# Patient Record
Sex: Female | Born: 1980 | Race: Black or African American | Hispanic: No | Marital: Married | Smoking: Never smoker
Health system: Southern US, Community
[De-identification: ages and names within clinical notes are randomized; demographics above are authoritative.]

## PROBLEM LIST (undated history)

## (undated) DIAGNOSIS — Z302 Encounter for sterilization: Secondary | ICD-10-CM

## (undated) DIAGNOSIS — D649 Anemia, unspecified: Secondary | ICD-10-CM

## (undated) DIAGNOSIS — K649 Unspecified hemorrhoids: Secondary | ICD-10-CM

## (undated) DIAGNOSIS — O009 Unspecified ectopic pregnancy without intrauterine pregnancy: Secondary | ICD-10-CM

## (undated) DIAGNOSIS — C801 Malignant (primary) neoplasm, unspecified: Secondary | ICD-10-CM

## (undated) DIAGNOSIS — Z90710 Acquired absence of both cervix and uterus: Secondary | ICD-10-CM

## (undated) DIAGNOSIS — K449 Diaphragmatic hernia without obstruction or gangrene: Secondary | ICD-10-CM

## (undated) DIAGNOSIS — I10 Essential (primary) hypertension: Secondary | ICD-10-CM

## (undated) DIAGNOSIS — J45909 Unspecified asthma, uncomplicated: Secondary | ICD-10-CM

## (undated) DIAGNOSIS — E669 Obesity, unspecified: Secondary | ICD-10-CM

## (undated) DIAGNOSIS — J40 Bronchitis, not specified as acute or chronic: Secondary | ICD-10-CM

## (undated) DIAGNOSIS — T7840XA Allergy, unspecified, initial encounter: Secondary | ICD-10-CM

## (undated) DIAGNOSIS — K222 Esophageal obstruction: Secondary | ICD-10-CM

## (undated) DIAGNOSIS — K589 Irritable bowel syndrome without diarrhea: Secondary | ICD-10-CM

## (undated) DIAGNOSIS — K219 Gastro-esophageal reflux disease without esophagitis: Secondary | ICD-10-CM

## (undated) DIAGNOSIS — J309 Allergic rhinitis, unspecified: Secondary | ICD-10-CM

## (undated) DIAGNOSIS — R0683 Snoring: Secondary | ICD-10-CM

## (undated) DIAGNOSIS — K8020 Calculus of gallbladder without cholecystitis without obstruction: Secondary | ICD-10-CM

## (undated) HISTORY — DX: Unspecified hemorrhoids: K64.9

## (undated) HISTORY — PX: TUBAL LIGATION: SHX77

## (undated) HISTORY — DX: Esophageal obstruction: K22.2

## (undated) HISTORY — DX: Allergic rhinitis, unspecified: J30.9

## (undated) HISTORY — DX: Calculus of gallbladder without cholecystitis without obstruction: K80.20

## (undated) HISTORY — PX: CRYOABLATION: SHX1415

## (undated) HISTORY — PX: UPPER GI ENDOSCOPY: SHX6162

## (undated) HISTORY — DX: Essential (primary) hypertension: I10

## (undated) HISTORY — DX: Gastro-esophageal reflux disease without esophagitis: K21.9

## (undated) HISTORY — DX: Snoring: R06.83

## (undated) HISTORY — DX: Irritable bowel syndrome, unspecified: K58.9

## (undated) HISTORY — DX: Obesity, unspecified: E66.9

## (undated) HISTORY — DX: Diaphragmatic hernia without obstruction or gangrene: K44.9

## (undated) HISTORY — PX: SALPINGOOPHORECTOMY: SHX82

## (undated) HISTORY — DX: Allergy, unspecified, initial encounter: T78.40XA

---

## 2001-04-24 ENCOUNTER — Other Ambulatory Visit: Admission: RE | Admit: 2001-04-24 | Discharge: 2001-04-24 | Payer: Self-pay | Admitting: Obstetrics and Gynecology

## 2001-05-01 ENCOUNTER — Observation Stay (HOSPITAL_COMMUNITY): Admission: AD | Admit: 2001-05-01 | Discharge: 2001-05-02 | Payer: Self-pay | Admitting: Obstetrics & Gynecology

## 2001-05-01 ENCOUNTER — Encounter: Payer: Self-pay | Admitting: Obstetrics & Gynecology

## 2001-05-02 ENCOUNTER — Encounter: Payer: Self-pay | Admitting: Obstetrics & Gynecology

## 2001-05-13 ENCOUNTER — Inpatient Hospital Stay (HOSPITAL_COMMUNITY): Admission: AD | Admit: 2001-05-13 | Discharge: 2001-05-13 | Payer: Self-pay | Admitting: Obstetrics and Gynecology

## 2001-05-16 ENCOUNTER — Ambulatory Visit (HOSPITAL_COMMUNITY): Admission: RE | Admit: 2001-05-16 | Discharge: 2001-05-16 | Payer: Self-pay | Admitting: Obstetrics & Gynecology

## 2001-05-16 ENCOUNTER — Encounter: Payer: Self-pay | Admitting: Obstetrics & Gynecology

## 2001-08-19 ENCOUNTER — Inpatient Hospital Stay (HOSPITAL_COMMUNITY): Admission: AD | Admit: 2001-08-19 | Discharge: 2001-08-19 | Payer: Self-pay | Admitting: Obstetrics & Gynecology

## 2001-08-19 ENCOUNTER — Encounter: Payer: Self-pay | Admitting: Obstetrics and Gynecology

## 2001-08-28 ENCOUNTER — Inpatient Hospital Stay (HOSPITAL_COMMUNITY): Admission: AD | Admit: 2001-08-28 | Discharge: 2001-08-28 | Payer: Self-pay | Admitting: Obstetrics and Gynecology

## 2001-09-19 ENCOUNTER — Inpatient Hospital Stay (HOSPITAL_COMMUNITY): Admission: AD | Admit: 2001-09-19 | Discharge: 2001-09-25 | Payer: Self-pay | Admitting: *Deleted

## 2001-09-21 ENCOUNTER — Encounter: Payer: Self-pay | Admitting: *Deleted

## 2001-09-23 ENCOUNTER — Encounter: Payer: Self-pay | Admitting: Obstetrics and Gynecology

## 2001-09-25 ENCOUNTER — Encounter: Payer: Self-pay | Admitting: *Deleted

## 2001-10-07 ENCOUNTER — Encounter: Payer: Self-pay | Admitting: Obstetrics & Gynecology

## 2001-10-07 ENCOUNTER — Inpatient Hospital Stay (HOSPITAL_COMMUNITY): Admission: AD | Admit: 2001-10-07 | Discharge: 2001-10-07 | Payer: Self-pay | Admitting: Obstetrics & Gynecology

## 2001-10-29 ENCOUNTER — Inpatient Hospital Stay (HOSPITAL_COMMUNITY): Admission: AD | Admit: 2001-10-29 | Discharge: 2001-10-29 | Payer: Self-pay | Admitting: Obstetrics and Gynecology

## 2001-11-17 ENCOUNTER — Inpatient Hospital Stay (HOSPITAL_COMMUNITY): Admission: AD | Admit: 2001-11-17 | Discharge: 2001-11-17 | Payer: Self-pay | Admitting: Obstetrics and Gynecology

## 2001-11-26 ENCOUNTER — Inpatient Hospital Stay (HOSPITAL_COMMUNITY): Admission: AD | Admit: 2001-11-26 | Discharge: 2001-11-26 | Payer: Self-pay | Admitting: Obstetrics and Gynecology

## 2001-11-29 ENCOUNTER — Inpatient Hospital Stay (HOSPITAL_COMMUNITY): Admission: AD | Admit: 2001-11-29 | Discharge: 2001-12-01 | Payer: Self-pay | Admitting: Obstetrics and Gynecology

## 2002-01-09 ENCOUNTER — Other Ambulatory Visit: Admission: RE | Admit: 2002-01-09 | Discharge: 2002-01-09 | Payer: Self-pay | Admitting: Obstetrics and Gynecology

## 2004-01-13 ENCOUNTER — Emergency Department (HOSPITAL_COMMUNITY): Admission: EM | Admit: 2004-01-13 | Discharge: 2004-01-13 | Payer: Self-pay | Admitting: Family Medicine

## 2005-03-14 ENCOUNTER — Emergency Department (HOSPITAL_COMMUNITY): Admission: EM | Admit: 2005-03-14 | Discharge: 2005-03-14 | Payer: Self-pay | Admitting: Emergency Medicine

## 2006-04-07 ENCOUNTER — Emergency Department (HOSPITAL_COMMUNITY): Admission: EM | Admit: 2006-04-07 | Discharge: 2006-04-07 | Payer: Self-pay | Admitting: Emergency Medicine

## 2007-07-04 HISTORY — PX: CHOLECYSTECTOMY: SHX55

## 2007-07-16 ENCOUNTER — Encounter: Admission: RE | Admit: 2007-07-16 | Discharge: 2007-07-16 | Payer: Self-pay | Admitting: Internal Medicine

## 2007-07-25 ENCOUNTER — Ambulatory Visit: Payer: Self-pay | Admitting: Internal Medicine

## 2007-07-25 DIAGNOSIS — J45909 Unspecified asthma, uncomplicated: Secondary | ICD-10-CM | POA: Insufficient documentation

## 2007-07-25 DIAGNOSIS — J310 Chronic rhinitis: Secondary | ICD-10-CM

## 2007-07-31 ENCOUNTER — Telehealth: Payer: Self-pay | Admitting: Internal Medicine

## 2007-11-26 ENCOUNTER — Emergency Department (HOSPITAL_COMMUNITY): Admission: EM | Admit: 2007-11-26 | Discharge: 2007-11-27 | Payer: Self-pay | Admitting: Emergency Medicine

## 2007-11-29 ENCOUNTER — Encounter: Admission: RE | Admit: 2007-11-29 | Discharge: 2007-11-29 | Payer: Self-pay | Admitting: Internal Medicine

## 2007-12-27 ENCOUNTER — Encounter (INDEPENDENT_AMBULATORY_CARE_PROVIDER_SITE_OTHER): Payer: Self-pay | Admitting: General Surgery

## 2007-12-27 ENCOUNTER — Observation Stay (HOSPITAL_COMMUNITY): Admission: EM | Admit: 2007-12-27 | Discharge: 2007-12-28 | Payer: Self-pay | Admitting: Emergency Medicine

## 2008-01-10 ENCOUNTER — Ambulatory Visit: Payer: Self-pay | Admitting: Vascular Surgery

## 2008-01-10 ENCOUNTER — Emergency Department (HOSPITAL_COMMUNITY): Admission: EM | Admit: 2008-01-10 | Discharge: 2008-01-10 | Payer: Self-pay | Admitting: Emergency Medicine

## 2008-01-13 ENCOUNTER — Encounter: Admission: RE | Admit: 2008-01-13 | Discharge: 2008-01-13 | Payer: Self-pay | Admitting: General Surgery

## 2009-07-01 IMAGING — CR DG CERVICAL SPINE 1V
1 series · 1 of 1 positions shown · non-contrast
Comparison: none

CLINICAL DATA: Feels like throat closes up. Some shortness of breath.
 LATERAL VIEW OF THE NECK:

[w c-spine lat]
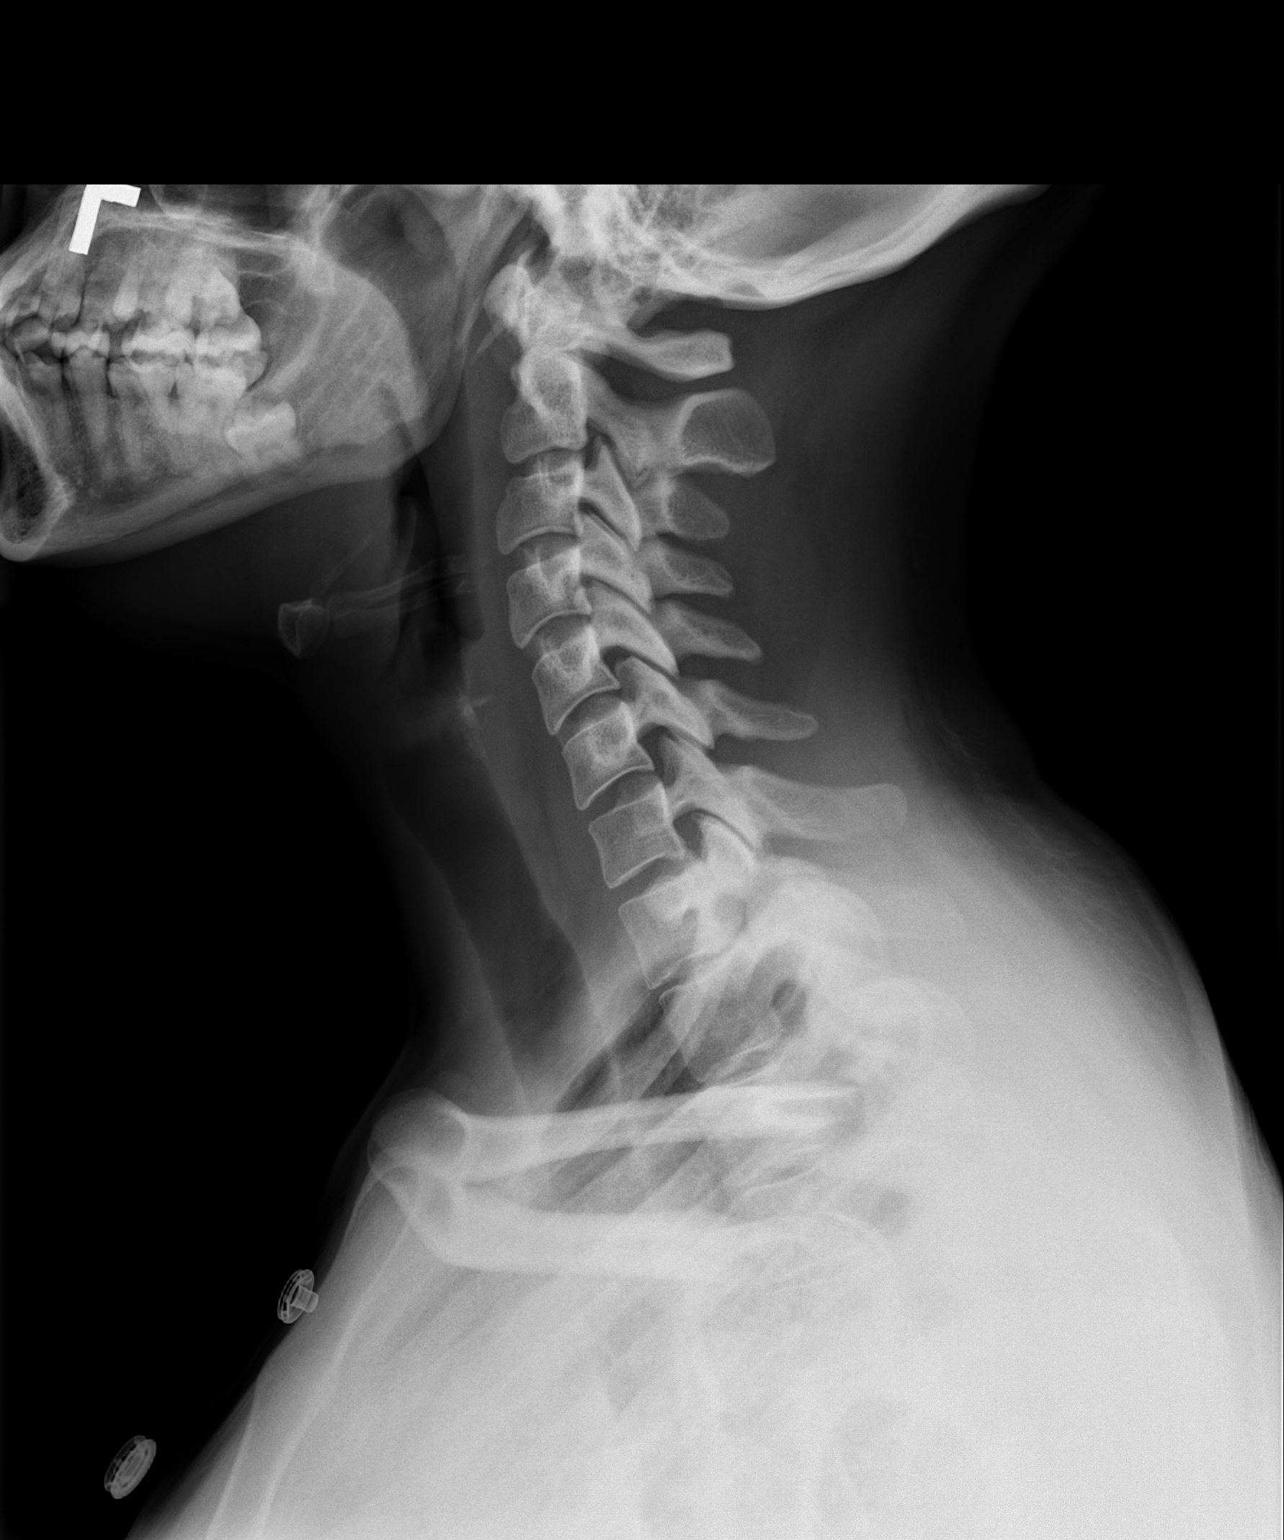

[1 of 1 positions shown; findings below may reference images not displayed]

FINDINGS: A lateral view of the neck shows the cervical vertebra to be in normal alignment with normal disc spaces.  No prevertebral soft tissue swelling is seen.  The airway appears normal.  The epiglottis is well visualized and is within normal limits.
IMPRESSION: Negative lateral soft tissue view of the neck.

## 2009-12-12 IMAGING — RF DG CHOLANGIOGRAM OPERATIVE
1 series · 17 of 24 positions shown · non-contrast
Comparison: Abdominal ultrasound 11/29/2007.

CLINICAL DATA: Cholelithiasis.  Laparoscopic cholecystectomy.

INTRAOPERATIVE CHOLANGIOGRAM
TECHNIQUE: Multiple fluoroscopic spot radiographs were obtained
during intraoperative cholangiogram and are submitted for
interpretation post-operatively.

[Series 0: run · 17 of 33 slices shown]
[im 1/33]
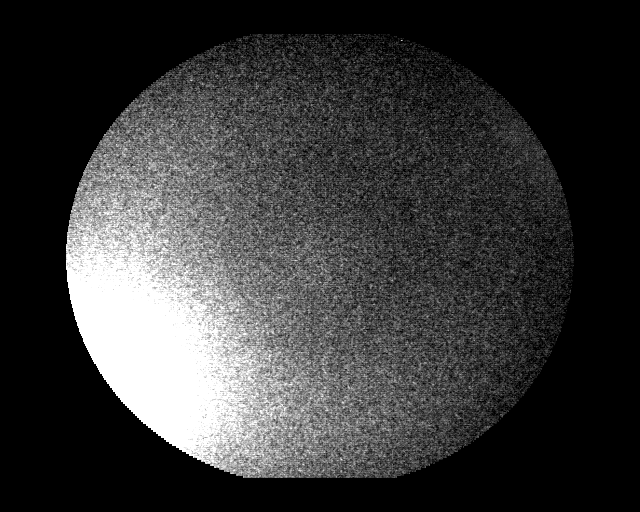
[im 3/33]
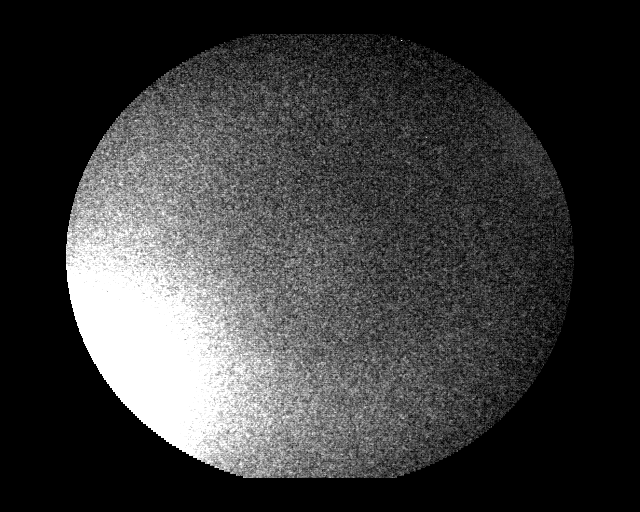
[im 5/33]
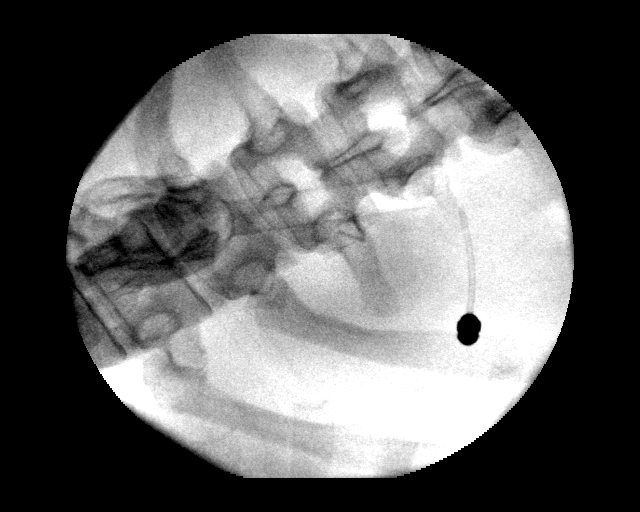
[im 6/33]
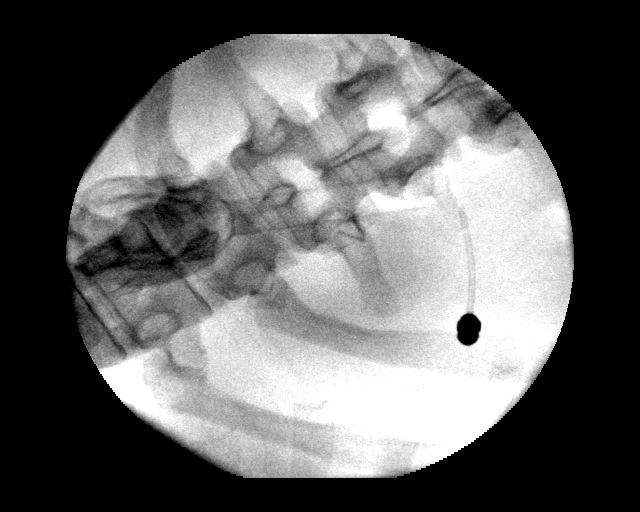
[im 9/33]
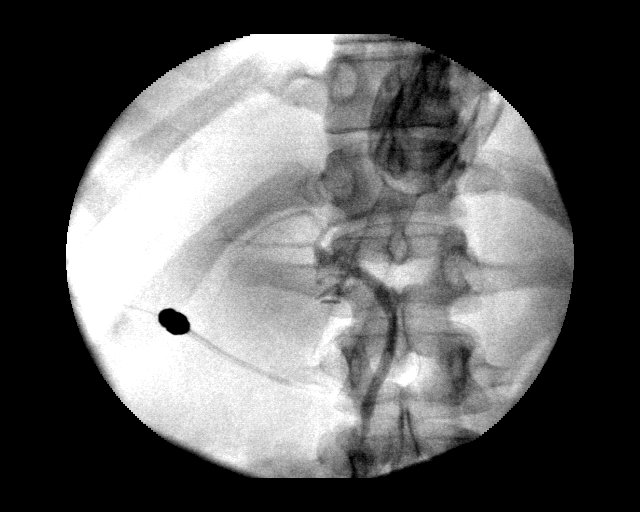
[im 10/33]
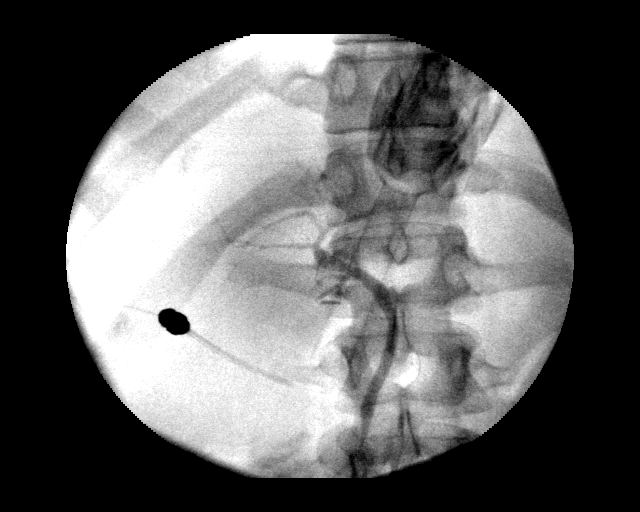
[im 13/33]
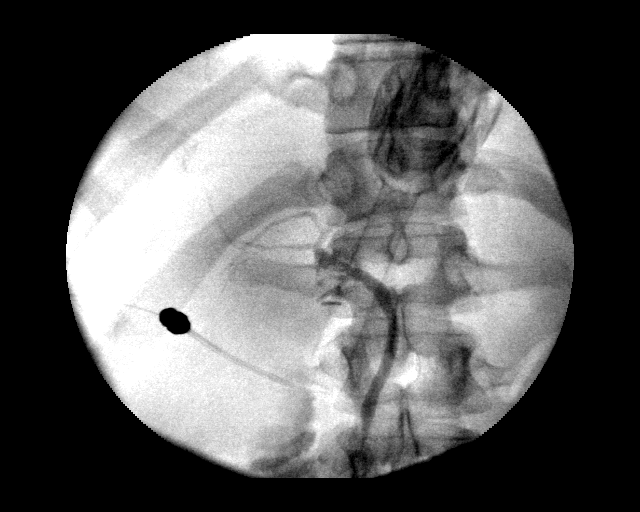
[im 14/33]
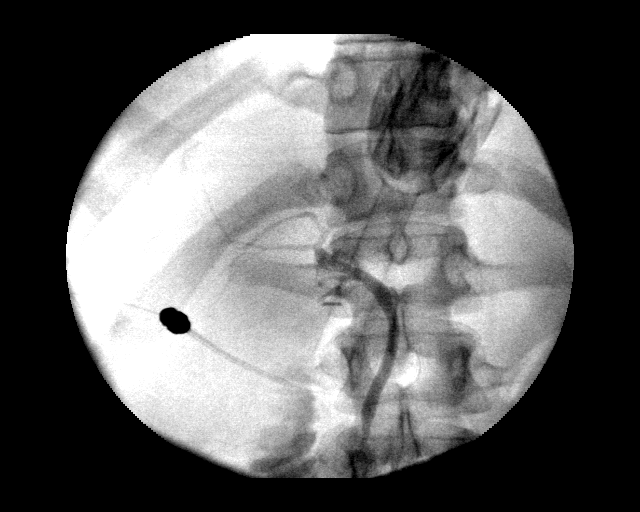
[im 17/33]
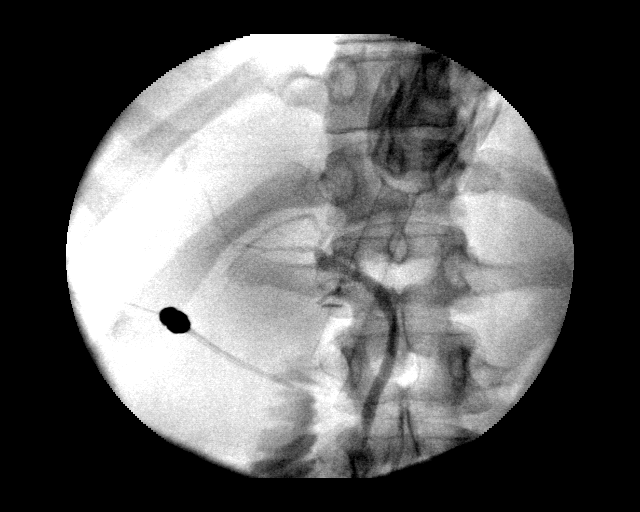
[im 19/33]
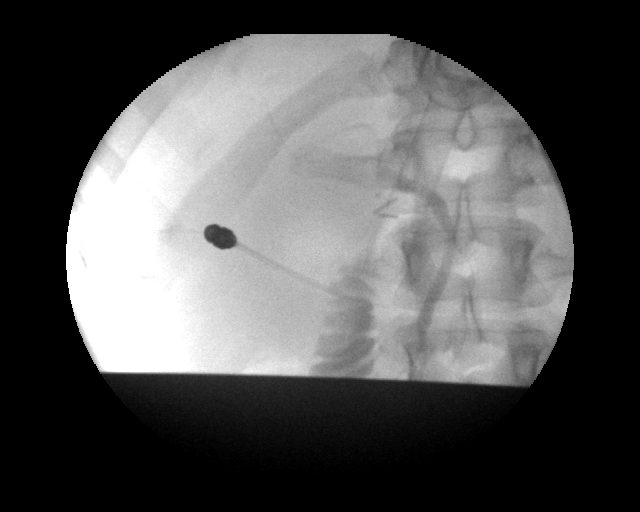
[im 20/33]
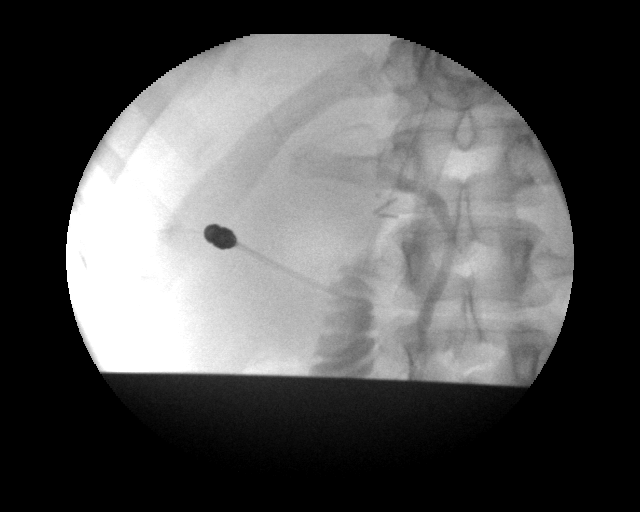
[im 23/33]
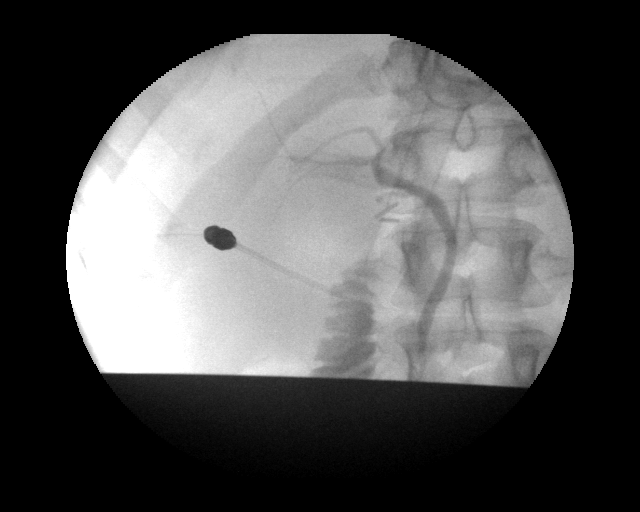
[im 24/33]
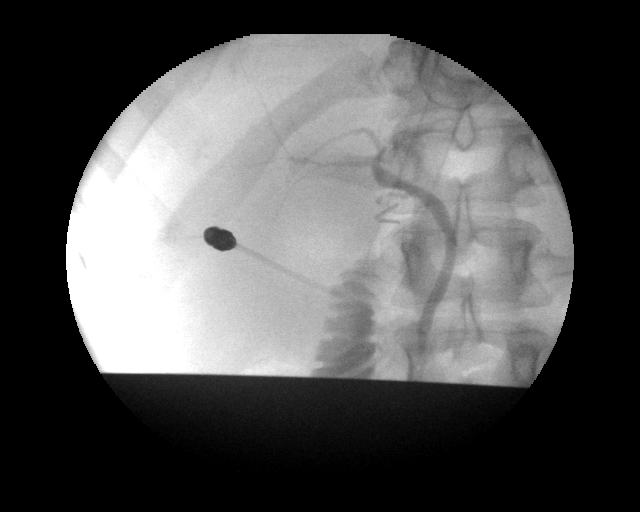
[im 27/33]
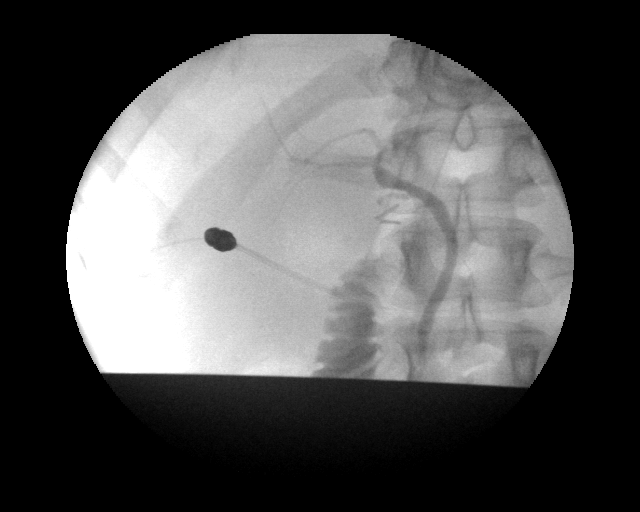
[im 28/33]
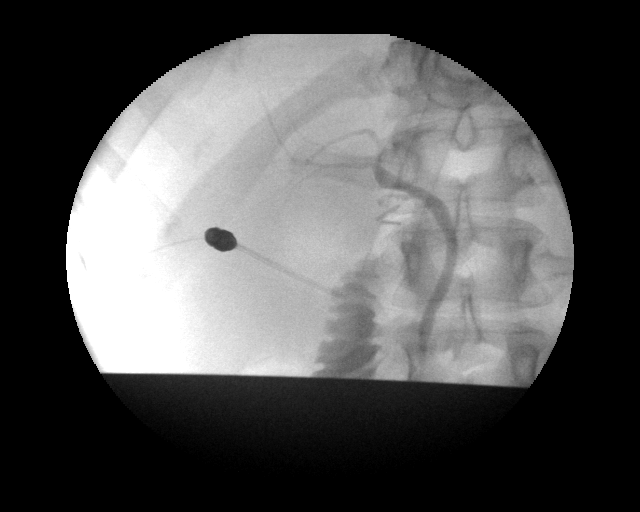
[im 30/33]
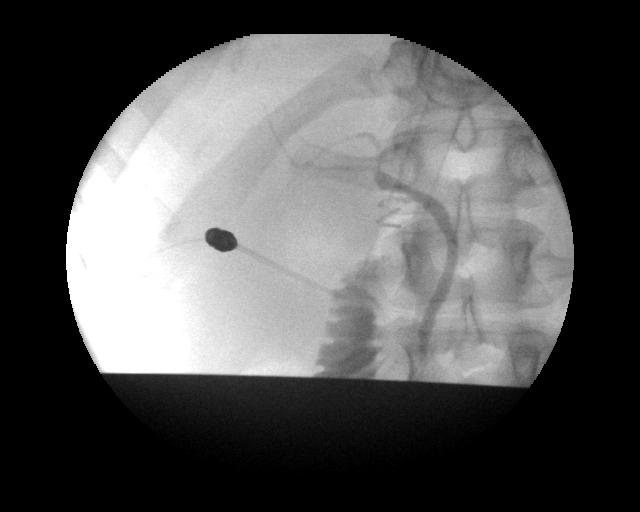
[im 33/33]
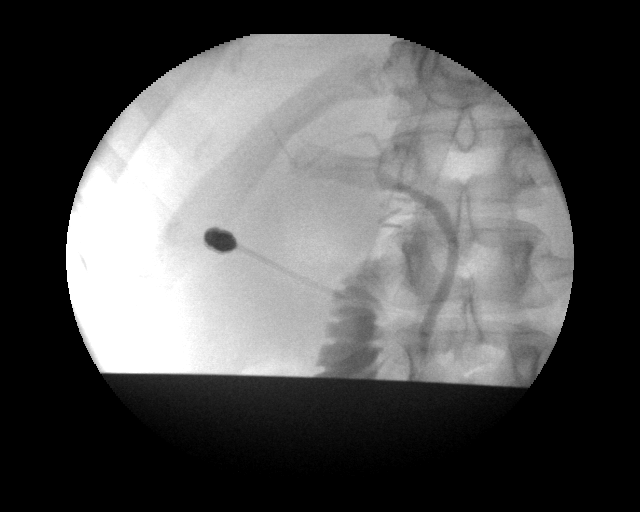

[17 of 24 positions shown; findings below may reference images not displayed]

FINDINGS: The common bile duct is normal in diameter.  No definite
retained calculi are demonstrated.  There is drainage into the
duodenum and no evidence of extravasation.
IMPRESSION: No evidence of ductal obstruction or retained calculus.

## 2010-11-15 NOTE — Op Note (Signed)
NAMEAuda, Emily Orozco               ACCOUNT NO.:  [id number]   MEDICAL RECORD NO.:  [id number]          PATIENT TYPE:  INP   LOCATION:  5151                         FACILITY:  [facility]   PHYSICIAN:  Gabrielle Dare. Janee Morn, M.D.DATE OF BIRTH:  1980-12-01   DATE OF PROCEDURE:  12/27/2007  DATE OF DISCHARGE:                               OPERATIVE REPORT   PREOPERATIVE DIAGNOSES:  Biliary colic with significant nausea and  vomiting.   POSTOPERATIVE DIAGNOSES:  Biliary colic with significant nausea and  vomiting.   PROCEDURE:  Laparoscopic cholecystectomy with intraoperative  cholangiogram.   SURGEON:  Gabrielle Dare. Janee Morn, M.D.   ASSISTANT:  Letha Cape, PA-C.   HISTORY OF PRESENT ILLNESS:  Emily Orozco is a 30 year old African  American female who is evaluated in our office by Dr. Bertram Savin for  biliary colic.  She was scheduled to have laparoscopic cholecystectomy  in mid July.  However, she has had an increasing frequency of attacks  and presented to the emergency department last night with severe attack  of right upper quadrant pain.  The white count was normal.  Liver  function tests were normal, however, while the pain resolved with some  pain medications, she had a severe nausea and vomiting.  We are bringing  her urgently to the operating room for cholecystectomy.   PROCEDURE IN DETAIL:  Informed consent was obtained.  The patient was  identified in the preoperative holding area.  She received intravenous  antibiotics.  She was brought to operating room.  General endotracheal  anesthesia was administered by the anesthesia staff.  Her abdomen was  prepped and draped in a sterile fashion.  Infraumbilical region was then  infiltrated with 0.25% Marcaine with epinephrine.  Infraumbilical  incision was made.  Subcutaneous tissues were dissected down revealing  the anterior fascia.  This was divided sharply, and the peritoneal  cavity was entered under direct vision.  A 0 Vicryl  pursestring suture  was placed around the fascial opening.  A Hasson trocar was inserted  into the abdomen.  The abdomen was insufflated with carbon dioxide in  standard fashion.  Under direct vision, an 11-mm epigastric and two 5-mm  lateral ports were placed.  A 0.25% Marcaine with epinephrine was used  at all port sites.  The dome of the gallbladder was retracted  superomedially.  The infundibulum was retracted inferolaterally.  Dissection began laterally and progressed medially, easily identifying  the cystic duct in the anterior branch of the cystic artery.   Cystic duct was dissected until a large window was created between the  infundibulum, the cystic duct, and the liver.  Once this was done with  excellent visualization, a clip was placed on the infundibular cystic  junction.  A Reddick cholangiogram catheter was inserted and  intraoperative cholangiogram was obtained, demonstrating no common bile  duct filling defects and good flow of contrast into the duodenum.  Cholangiogram catheter was removed.  Three clips were placed proximally  on the cystic duct, and it was divided.  The anterior branch of the  cystic artery clipped twice proximally and once  distally and divided.  Further dissection revealed a posterior branch of the cystic artery,  which was clipped twice proximally and divided distally with cautery.  The gallbladder was taken off from the liver bed with the Bovie cautery  achieving excellent hemostasis.  Gallbladder was placed in an EndoCatch  bag and removed from the abdomen via the infraumbilical port site.  The  abdomen was copiously irrigated with saline.  The irrigation fluid  returned clear.  The liver bed was rechecked and remained dry.  Clips  were in excellent position.   The remainder of the irrigation fluid was evacuated.  Ports were removed  under direct vision.  The Hasson trocar was removed, the infraumbilical  fascia was closed at that time with  0-Vicryl pursestring suture with  care not to trap any intraabdominal contents.  All 4 wounds were  copiously irrigated.  Skin edges were closed with running 4-0 Vicryl  subcuticular stitch and then covered with Dermabond.  The sponge,  needle, and instrument counts were all correct.  The patient tolerated  the procedure well without apparent complications and was taken to the  recovery room in stable condition.      Gabrielle Dare Janee Morn, M.D.  Electronically Signed     BET/MEDQ  D:  12/27/2007  T:  12/28/2007  Job:  [id number]   cc:   Kari Baars, M.D.

## 2010-11-18 NOTE — Discharge Summary (Signed)
[facility]  Patient:    Emily Orozco, Emily Orozco I Visit Number: [id number] MRN: [id number]          Service Type: OBS Location: [facility] Attending Physician:  Genia Del Dictated by:   Sheria Lang. Cherly Hensen, M.D. Admit Date:  10/07/2001 Discharge Date: 10/07/2001                             Discharge Summary  ADMISSION DIAGNOSES:          1. Severe oligohydramnios.                               2. Preterm cervical change.                               3. Intrauterine gestation at 28-5/[redacted] weeks                                  gestation.  DISCHARGE DIAGNOSES:          1. Severe oligohydramnios, resolved.                               2. Preterm cervical change.                               3. Intrauterine gestation at 29-2/7 weeks.  CHIEF COMPLAINT:              Oligohydramnios and shortened cervix.  HISTORY OF PRESENT ILLNESS:   A 30 year old, Gravida 1, Para 0 female at 28-5/[redacted] weeks gestation was found to have severe oligohydramnios who is now being admitted for aggressive IV hydration.  Ultrasound at the office had revealed an amniotic fluid index of 7.4 which was at the second percentile. Estimated fetal weight of 1109 grams which is at the 12th percentile and a cervical lip of 1.8 cm.  HOSPITAL COURSE:              The patient was admitted to [facility] for aggressive IV hydration.  She was placed on continuous fetal monitoring.  She subsequently had a repeat ultrasound to check amniotic fluid index and this had still remained decreased.  Her amniotic fluid index was 9.7.  Cervical length had been 1.9.  Tracing was reassuring. She was changed to nonstress test twice per day and had a repeat biophysical profile. Amniotic fluid index done on September 23, 2001 as her fluid had not increased significantly.  On hospital day four the patient complained of rectal bleeding times one. There was no evidence of hemorrhoids on exam.  GI consultation was  entertained but not obtained and no recurrence of her symptoms.  The patient subsequently was given a Fleets enema for complaints of constipation.  Repeat amniotic fluid index on September 23, 2001 was 5.4.  Biophysical profile was 8/8 and normal Dopplers.  On hospital day six, the patient had a repeat of her amniotic fluid index that was then increased to 12 cm and the growth parameter was at the 33rd percentile.  The patient was given betamethasone on September 19, 2001 and September 20, 2001.  She was deemed well to be discharged home.  DISPOSITION:                  Home.  CONDITION ON DISCHARGE:       Stable.  DISCHARGE FOLLOW UP:          [facility] on September 30, 2001.  DISCHARGE INSTRUCTIONS:       Call for decreased fetal movement, contractions every six or more per hour, vaginal bleeding, or leakage of fluid.  DISCHARGE MEDICATIONS:        1. Prenatal vitamins one p.o. q.d.                               2. Zantac 150 mg one p.o. b.i.d. Dictated by:   Sheria Lang. Cherly Hensen, M.D. Attending Physician:  Genia Del DD:  10/22/01 TD:  10/23/01 Job: [id number]

## 2010-11-18 NOTE — Consult Note (Signed)
[facility]  Patient:    Emily Orozco, Emily Orozco Visit Number: [id number] MRN: [id number]          Service Type: OBS Location: [facility] Attending Physician:  Maxie Better Dictated by:   Lenoard Aden, M.D. Consultation Date: 11/17/01 Admit Date:  11/17/2001 Discharge Date: 11/17/2001   CC:         [facility]   Consultation Report  CHIEF COMPLAINT:              Contractions.  HISTORY OF PRESENT ILLNESS:   Patient is a 30 year old African-American female G1, P0, EDD December 08, 2001 at 37 weeks with increased contractions and low back pain.  PAST OBSTETRIC HISTORY:       Noncontributory.  ALLERGIES:                    No known drug allergies.  MEDICATIONS:                  Prenatal vitamins.  FAMILY HISTORY:               Myocardial infarction, adult-onset diabetes. Patient reports a questionably poorly described kidney problem, questionable history of arthritis.  PAST GYNECOLOGIC HISTORY:     Otherwise noncontributory.  PRENATAL LABORATORY DATA:     Blood type O+.  Rh antibody negative. Hemoglobin electrophoresis normal.  Rubella immune.  Hepatitis B surface negative.  HIV nonreactive.  Pregnancy complicated by preterm cervical change due to previous cryo surgery with stability noted and history of borderline growth, questionable SGA versus IUGR.  PHYSICAL EXAMINATION  GENERAL:                      Patient is a mildly uncomfortable appearing African-American female, but in no apparent distress.  HEENT:                        Normal.  LUNGS:                        Clear.  HEART:                        Regular rate and rhythm.  ABDOMEN:                      Soft, gravid, nontender.  Estimated fetal weight 5.5-6 pounds.  PELVIC:                       Cervix is fingertip, 80%, vertex, -2.  EXTREMITIES:                  No cords.  NEUROLOGIC:                   Nonfocal.  LABORATORIES:                 Fetal heart rate tracing  is reactive. Contractions are irregular, every one to five minutes.  IMPRESSION:                   1. This is a 37 week intrauterine pregnancy.                               2. Borderline small for gestational age versus  intrauterine growth retardation.                               3. History of preterm labor.  PLAN:                         Ambulate.  Continuous monitoring.  If reassuring strip is noted, will ambulate and recheck.  If progressive cervical change is noted, we will admit.  Otherwise, will consider discharge if no cervical change is noted. Dictated by:   Lenoard Aden, M.D. Attending Physician:  Maxie Better DD:  11/17/01 TD:  11/19/01 Job: [id number]

## 2010-11-18 NOTE — H&P (Signed)
[facility]  PatientLynia Orozco, [place] Visit Number: [id number] MRN: [id number]          Service Type: Attending:  Lenoard Aden, M.D. Dictated by:   Lenoard Aden, M.D. Adm. Date:  09/19/01                           History and Physical  CHIEF COMPLAINT:  Oligohydramnios, shortened cervix.  HISTORY OF PRESENT ILLNESS:  A 30 year old African-American female G1, P0 at 28 plus weeks with aforementioned problems.  PAST OBSTETRIC HISTORY:  Noncontributory.  ALLERGIES:  No known drug allergies.  MEDICATIONS  Prenatal vitamins.  PAST MEDICAL HISTORY: 1. Remote medical history of questionable arthritic type changes./ 2. History of kidney problems. 3. History of migraine headaches.  FAMILY HISTORY:  Family history of myocardial infarction and chronic hypertension, and leukemia.  PRENATAL LABORATORY DATA:  Reveals a blood type of O+, Rh antibody negative, hemoglobin electrophoresis normal.   Rubella immune, HBsAg negative, HIV nonreactive, GC and chlamydia negative.  Pregnancy complicated by aforementioned complaints.  PHYSICAL EXAMINATION:  GENERAL:  She is a well-developed, well-nourished African-American female in no acute distress.  HEENT:  Normal.  LUNGS:  Clear.  HEART:  Regular rhythm.  ABDOMEN:  Soft, gravid, nontender.  CERVICAL:  As described in the office.  Approximate cervical length of 1.8 cm.  EXTREMITIES:  Reveal no cords.  NEUROLOGIC:  Nonfocal.  IMPRESSION:  A 28 week intrauterine pregnancy with preterm cervical change and new onset oligohydramnios.  PLAN: 1. Aggressive IV fluid hydration. 2. Bed rest. 3. Continuous monitoring x24 hours then intermittent monitoring if reassuring. 4. Follow up AFI in 48 hours. Dictated by:   Lenoard Aden, M.D. Attending:  Lenoard Aden, M.D. DD:  09/21/01 TD:  09/21/01 Job: [id number]

## 2010-11-18 NOTE — Consult Note (Signed)
[facility]  Patient:    Emily Orozco, Emily Orozco I Visit Number: [id number] MRN: [id number]          Service Type: OBS Location: [facility] Attending Physician:  Lenoard Aden Dictated by:   Lenoard Aden, M.D. Consultation Date: 08/28/01 Admit Date:  08/28/2001   CC:         [facility]   Consultation Report  CHIEF COMPLAINT:              Rule out preterm labor.  HISTORY OF PRESENT ILLNESS:   The patient is a 30 year old African-American female, G1, P0, at 25+ weeks with questionable preterm cervical change and cramping.  PAST MEDICAL HISTORY:         1. The patient has history of a breathing problem, questionably asthma, in remission for a long time.                               2. Questionable history of childhood arthritis.                               3. History of migraines.  ALLERGIES:                    No known drug allergies.  MEDICATIONS:                  Prenatal vitamins.  OBSTETRIC HISTORY:            Noncontributory.  FAMILY HISTORY:               Leukemia, myocardial infarction, and chronic hypertension.  PRENATAL LABORATORY DATA:     Blood type O positive, Rh antibody negative. Hemoglobin and electrophoresis normal.  Hepatitis B surface antigen negative. HIV nonreactive.  PREGNANCY COMPLICATIONS:      Preterm cervical change.  No other medial or surgical hospitalization.  She has had a normal anatomical survey at 20 weeks.  PHYSICAL EXAMINATION:  VITAL SIGNS:                  Stable.  Blood pressure 120/80, pulse 80, respirations 16.  HEENT:                        Normal.  LUNGS:                        Clear.  HEART:                        Regular rhythm.  ABDOMEN:                      Soft, gravid, and nontender.  CERVICAL:                     Per Dr. Cherly Hensen - closed with softening noted. Repeat cervical exam, per nurse, is closed, long, and out of the pelvis.  EXTREMITIES:                  No  cords.  NEUROLOGIC:                   Nonfocal.  NONSTRESS TEST:               Reassuring for 25 weeks with  no evidence of uterine contractions in a one hour period.  IMPRESSION:                   1. A 25 week intrauterine pregnancy.                               2. Preterm cervical change without evidence of                                  active contractions.                               3. No evidence of infection noted.  PLAN:                         1. Ibuprofen 600 mg q.6h. x 48 hours.                               2. Follow up in the office in one week.                               3. Preterm labor warnings given.                               4. Continue bed rest, as prescribed. Dictated by:   Lenoard Aden, M.D. Attending Physician:  Lenoard Aden DD:  08/28/01 TD:  08/29/01 Job: [id number]

## 2010-11-18 NOTE — H&P (Signed)
[facility]  Patient:    Emily Orozco, Emily Orozco Visit Number: [id number] MRN: [id number]          Service Type: OBS Location: [id number] Attending Physician:  Ermalene Searing Dictated by:   Sheria Lang Cherly Hensen, M.D. Admit Date:  09/19/2001                           History and Physical  CHIEF COMPLAINT:              Oligohydramnios, preterm cervical change.  HISTORY OF PRESENT ILLNESS:   This is a 30 year old gravida 1 para 0 female with an EDC of December 08, 2001 by ultrasound who is now at 69 and four-sevenths weeks gestation and who is being admitted for aggressive IV hydration secondary to oligohydramnios noted on ultrasound today.  The patients ultrasound on September 19, 2001 revealed an amniotic fluid index of 7.4 which was at the second percentile.  In addition, her estimated fetal weight is 1109 g which is at the 12th percentile with the abdominal circumference at around the 10th percentile.  The cervical length was 1.8 cm.  The patient had a previous cervical length on August 19, 2001 at [facility] which was 3.7 and estimated fetal weight at that time of 632 g which was at the 37th percentile. She had been followed for preterm cervical change with the last ultrasound on August 13, 2001 showing a cervical length of 2.6 cm, and a fetal fibronectin study done on September 05, 2001 which was negative.  The patient denies any leakage of fluid.  She has been having some lower abdominal pain which she thinks is contractions.  She has noted good fetal movement and no vaginal bleeding.  Prenatal care is at [facility], primary obstetrician Sheronette A. Cherly Hensen, M.D.  PRENATAL LABORATORY DATA:     Blood type is O positive, antibody screen is negative.  Hemoglobin electrophoresis normal.  RPR is nonreactive.  Rubella immune.  Hepatitis B surface antigen negative.  HIV test negative.  GC and chlamydia cultures were negative.  Pap was normal.  AFP3  was normal. Ultrasound on July 31, 2001 showed 20.4 weeks, normal anatomic fetal survey, and as discussed, cervical length was normal at that time.  Her one-hour GCT is normal.  COURSE OF PREGNANCY:          The patients initial portion of her prenatal care was complicated by the finding of a right ovarian lesion that was about 2.3 cm with an echogenic component.  This was serially followed until it was not seen further on ultrasound.  The patient also had transient hyperemesis gravidarum, ultimately responded to Phenergan.  PAST MEDICAL HISTORY:  ALLERGIES:                    No known drug allergies.  MEDICATIONS:                  Prenatal vitamins, Zantac, iron supplement, and Phenergan 25 mg p.o. q.6h. p.r.n.  MEDICAL HISTORY:              Question asthma, migraines.  OBSTETRICAL HISTORY:          Negative.  FAMILY HISTORY:               Father deceased, leukemia at age 18.  Maternal grandmother, maternal aunt, and maternal great-aunt - hypertension.  Maternal grandmother - myocardial infarct.  Paternal grandfather -  stroke.  GYNECOLOGICAL HISTORY:        Cryosurgery in 1996.  SOCIAL HISTORY:               Married, nonsmoker.  Government social research officer for [employer].  REVIEW OF SYSTEMS:            Negative except for as noted in the history of present illness.  PHYSICAL EXAMINATION:  GENERAL:                      Well-developed, well-nourished gravida black female in no acute distress.  VITAL SIGNS:                  Blood pressure 110/70, weight 167.6 pounds.  SKIN:                         Shows no lesions.  HEENT:                        Anicteric sclerae, pink conjunctivae. Oropharynx negative.  HEART:                        Regular rate and rhythm without murmur.  LUNGS:                        Clear to auscultation.  BREASTS:                      Soft, nontender, no palpable mass.  ABDOMEN:                      Fundal height about 26  cm.  PELVIC:                       Closed, developed lower uterine segment, about 70%, -1, vertex presentation.  EXTREMITIES:                  No edema.  IMPRESSION:                   1. Severe oligohydramnios.                               2. Preterm cervical change with the risk factor                                  of cryosurgery in the past.                               3. Small-for-gestational age fetus.                               4. Intrauterine gestation at 35 and                                  four-sevenths weeks.  PLAN:                         1. Admission.  2. Continuous fetal monitoring.                               3. Aggressive IV hydration.                               4. Repeat amniotic fluid index on Saturday.                               5. Betamethasone injection today and tomorrow.                               6. Evaluate for contractions. Dictated by:   Sheria Lang. Cherly Hensen, M.D. Attending Physician:  Marina Gravel B DD:  09/19/01 TD:  09/20/01 Job: [id number]

## 2011-03-29 LAB — CBC
Platelets: 495 — ABNORMAL HIGH
RDW: 12.6

## 2011-03-29 LAB — DIFFERENTIAL
Eosinophils Absolute: 0.1
Lymphocytes Relative: 20
Neutro Abs: 7.9 — ABNORMAL HIGH

## 2011-03-29 LAB — URINALYSIS, ROUTINE W REFLEX MICROSCOPIC
Bilirubin Urine: NEGATIVE
Hgb urine dipstick: NEGATIVE
Ketones, ur: NEGATIVE
Protein, ur: NEGATIVE
Specific Gravity, Urine: 1.007
Urobilinogen, UA: 0.2
pH: 7

## 2011-03-29 LAB — POCT I-STAT, CHEM 8
Calcium, Ion: 1.12
Hemoglobin: 12.9
Potassium: 3.7

## 2011-03-29 LAB — HEPATIC FUNCTION PANEL: Indirect Bilirubin: 0.4

## 2011-03-30 LAB — CBC
Hemoglobin: 12.8
MCHC: 34.9
Platelets: 460 — ABNORMAL HIGH
WBC: 9.4

## 2011-03-30 LAB — POCT I-STAT, CHEM 8
BUN: 8
Calcium, Ion: 1.14
Chloride: 103
Glucose, Bld: 120 — ABNORMAL HIGH
Glucose, Bld: 92
HCT: 36
Hemoglobin: 12.9
Hemoglobin: 12.2
Sodium: 136
TCO2: 25
TCO2: 23

## 2011-03-30 LAB — HEPATIC FUNCTION PANEL
ALT: 26
AST: 48 — ABNORMAL HIGH
Albumin: 3.8
Indirect Bilirubin: 0.5
Total Protein: 7

## 2011-03-30 LAB — LIPASE, BLOOD: Lipase: 22

## 2011-03-30 LAB — DIFFERENTIAL
Basophils Absolute: 0
Lymphocytes Relative: 18
Neutro Abs: 7.1

## 2012-07-03 DIAGNOSIS — O009 Unspecified ectopic pregnancy without intrauterine pregnancy: Secondary | ICD-10-CM

## 2012-07-03 HISTORY — DX: Unspecified ectopic pregnancy without intrauterine pregnancy: O00.90

## 2012-07-29 ENCOUNTER — Encounter (HOSPITAL_COMMUNITY)
Admission: AD | Disposition: A | Discharge status: Home / Self Care | Payer: Self-pay | Source: Ambulatory Visit | Attending: Obstetrics and Gynecology

## 2012-07-29 ENCOUNTER — Ambulatory Visit: Admit: 2012-07-29 | Payer: Self-pay | Admitting: Obstetrics and Gynecology

## 2012-07-29 ENCOUNTER — Encounter (HOSPITAL_COMMUNITY): Payer: Self-pay | Admitting: Anesthesiology

## 2012-07-29 ENCOUNTER — Inpatient Hospital Stay (HOSPITAL_COMMUNITY)
Admission: AD | Admit: 2012-07-29 | Discharge: 2012-07-30 | Disposition: A | Discharge status: Home / Self Care | Payer: 59 | Source: Ambulatory Visit | Attending: Obstetrics and Gynecology | Admitting: Obstetrics and Gynecology

## 2012-07-29 ENCOUNTER — Inpatient Hospital Stay (HOSPITAL_COMMUNITY): Payer: 59

## 2012-07-29 ENCOUNTER — Inpatient Hospital Stay (HOSPITAL_COMMUNITY): Payer: 59 | Admitting: Anesthesiology

## 2012-07-29 ENCOUNTER — Encounter (HOSPITAL_COMMUNITY): Payer: Self-pay | Admitting: *Deleted

## 2012-07-29 DIAGNOSIS — O009 Unspecified ectopic pregnancy without intrauterine pregnancy: Secondary | ICD-10-CM

## 2012-07-29 DIAGNOSIS — R109 Unspecified abdominal pain: Secondary | ICD-10-CM | POA: Insufficient documentation

## 2012-07-29 DIAGNOSIS — O00109 Unspecified tubal pregnancy without intrauterine pregnancy: Secondary | ICD-10-CM | POA: Insufficient documentation

## 2012-07-29 HISTORY — DX: Essential (primary) hypertension: I10

## 2012-07-29 HISTORY — PX: LAPAROSCOPY: SHX197

## 2012-07-29 LAB — CBC WITH DIFFERENTIAL/PLATELET
Eosinophils Relative: 1 % (ref 0–5)
HCT: 34.8 % — ABNORMAL LOW (ref 36.0–46.0)
Hemoglobin: 11.7 g/dL — ABNORMAL LOW (ref 12.0–15.0)
Lymphocytes Relative: 21 % (ref 12–46)
MCHC: 33.6 g/dL (ref 30.0–36.0)
MCV: 93.8 fL (ref 78.0–100.0)
Monocytes Absolute: 0.6 10*3/uL (ref 0.1–1.0)
Monocytes Relative: 6 % (ref 3–12)
Neutro Abs: 7.6 10*3/uL (ref 1.7–7.7)
WBC: 10.6 10*3/uL — ABNORMAL HIGH (ref 4.0–10.5)

## 2012-07-29 LAB — URINALYSIS, ROUTINE W REFLEX MICROSCOPIC
Leukocytes, UA: NEGATIVE
Specific Gravity, Urine: 1.015 (ref 1.005–1.030)
Urobilinogen, UA: 0.2 mg/dL (ref 0.0–1.0)

## 2012-07-29 LAB — URINE MICROSCOPIC-ADD ON

## 2012-07-29 LAB — WET PREP, GENITAL

## 2012-07-29 LAB — POCT PREGNANCY, URINE: Preg Test, Ur: POSITIVE — AB

## 2012-07-29 SURGERY — LAPAROSCOPY OPERATIVE
Anesthesia: General | Site: Abdomen | Laterality: Right | Wound class: Clean Contaminated

## 2012-07-29 MED ORDER — ONDANSETRON HCL 4 MG/2ML IJ SOLN
4.0000 mg | Freq: Once | INTRAMUSCULAR | Status: AC
  Administered 2012-07-29: 4 mg via INTRAVENOUS
  Filled 2012-07-29: qty 2

## 2012-07-29 MED ORDER — FENTANYL CITRATE 0.05 MG/ML IJ SOLN
INTRAMUSCULAR | Status: DC | PRN
  Administered 2012-07-29 (×3): 100 ug via INTRAVENOUS
  Administered 2012-07-29: 50 ug via INTRAVENOUS
  Administered 2012-07-29: 100 ug via INTRAVENOUS

## 2012-07-29 MED ORDER — LACTATED RINGERS IV SOLN
INTRAVENOUS | Status: DC
  Administered 2012-07-29 (×2): via INTRAVENOUS

## 2012-07-29 MED ORDER — CITRIC ACID-SODIUM CITRATE 334-500 MG/5ML PO SOLN
30.0000 mL | Freq: Once | ORAL | Status: AC
  Administered 2012-07-29: 30 mL via ORAL
  Filled 2012-07-29: qty 15

## 2012-07-29 MED ORDER — LACTATED RINGERS IR SOLN
Status: DC | PRN
  Administered 2012-07-29: 3000 mL

## 2012-07-29 MED ORDER — SUCCINYLCHOLINE CHLORIDE 20 MG/ML IJ SOLN
INTRAMUSCULAR | Status: DC | PRN
  Administered 2012-07-29: 140 mg via INTRAVENOUS

## 2012-07-29 MED ORDER — PROPOFOL 10 MG/ML IV BOLUS
INTRAVENOUS | Status: DC | PRN
  Administered 2012-07-29: 160 mg via INTRAVENOUS
  Administered 2012-07-30: 40 mg via INTRAVENOUS

## 2012-07-29 MED ORDER — ROCURONIUM BROMIDE 100 MG/10ML IV SOLN
INTRAVENOUS | Status: DC | PRN
  Administered 2012-07-29: 20 mg via INTRAVENOUS

## 2012-07-29 MED ORDER — MIDAZOLAM HCL 5 MG/5ML IJ SOLN
INTRAMUSCULAR | Status: DC | PRN
  Administered 2012-07-29: 2 mg via INTRAVENOUS

## 2012-07-29 MED ORDER — LIDOCAINE HCL (CARDIAC) 20 MG/ML IV SOLN
INTRAVENOUS | Status: DC | PRN
  Administered 2012-07-29: 60 mg via INTRAVENOUS

## 2012-07-29 MED ORDER — FAMOTIDINE IN NACL 20-0.9 MG/50ML-% IV SOLN
20.0000 mg | Freq: Once | INTRAVENOUS | Status: AC
  Administered 2012-07-29: 20 mg via INTRAVENOUS
  Filled 2012-07-29: qty 50

## 2012-07-29 MED ORDER — DEXAMETHASONE SODIUM PHOSPHATE 10 MG/ML IJ SOLN
INTRAMUSCULAR | Status: DC | PRN
  Administered 2012-07-29: 10 mg via INTRAVENOUS

## 2012-07-29 MED ORDER — BUTORPHANOL TARTRATE 1 MG/ML IJ SOLN
1.0000 mg | Freq: Once | INTRAMUSCULAR | Status: AC
  Administered 2012-07-29: 1 mg via INTRAVENOUS
  Filled 2012-07-29: qty 1

## 2012-07-29 MED ORDER — BUPIVACAINE HCL (PF) 0.25 % IJ SOLN
INTRAMUSCULAR | Status: DC | PRN
  Administered 2012-07-29: 3 mL

## 2012-07-29 SURGICAL SUPPLY — 32 items
ADH SKN CLS APL DERMABOND .7 (GAUZE/BANDAGES/DRESSINGS)
BAG SPEC RTRVL LRG 6X4 10 (ENDOMECHANICALS) ×1
BLADE SURG 15 STRL LF C SS BP (BLADE) ×1 IMPLANT
BLADE SURG 15 STRL SS (BLADE) ×2
CABLE HIGH FREQUENCY MONO STRZ (ELECTRODE) IMPLANT
CATH ROBINSON RED A/P 16FR (CATHETERS) ×1 IMPLANT
CLOTH BEACON ORANGE TIMEOUT ST (SAFETY) ×2 IMPLANT
DERMABOND ADVANCED (GAUZE/BANDAGES/DRESSINGS)
DERMABOND ADVANCED .7 DNX12 (GAUZE/BANDAGES/DRESSINGS) IMPLANT
DRSG COVADERM PLUS 2X2 (GAUZE/BANDAGES/DRESSINGS) ×2 IMPLANT
FORCEPS CUTTING 33CM 5MM (CUTTING FORCEPS) IMPLANT
FORCEPS CUTTING 45CM 5MM (CUTTING FORCEPS) IMPLANT
GLOVE BIO SURGEON STRL SZ7.5 (GLOVE) ×4 IMPLANT
GOWN PREVENTION PLUS LG XLONG (DISPOSABLE) ×4 IMPLANT
GOWN PREVENTION PLUS XLARGE (GOWN DISPOSABLE) ×2 IMPLANT
NDL INSUFFLATION 14GA 120MM (NEEDLE) IMPLANT
NEEDLE INSUFFLATION 14GA 120MM (NEEDLE) IMPLANT
NS IRRIG 1000ML POUR BTL (IV SOLUTION) ×2 IMPLANT
PACK LAPAROSCOPY BASIN (CUSTOM PROCEDURE TRAY) ×2 IMPLANT
POUCH SPECIMEN RETRIEVAL 10MM (ENDOMECHANICALS) ×1 IMPLANT
PROTECTOR NERVE ULNAR (MISCELLANEOUS) ×2 IMPLANT
SET IRRIG TUBING LAPAROSCOPIC (IRRIGATION / IRRIGATOR) ×1 IMPLANT
SOLUTION ELECTROLUBE (MISCELLANEOUS) IMPLANT
STRIP CLOSURE SKIN 1/4X3 (GAUZE/BANDAGES/DRESSINGS) ×2 IMPLANT
SUT PDS AB 0 CT1 27 (SUTURE) ×1 IMPLANT
SUT PLAIN 3 0 FS2 (SUTURE) ×2 IMPLANT
SUT VICRYL 0 UR6 27IN ABS (SUTURE) ×1 IMPLANT
TOWEL OR 17X24 6PK STRL BLUE (TOWEL DISPOSABLE) ×4 IMPLANT
TRAY FOLEY BAG SILVER LF 14FR (CATHETERS) ×1 IMPLANT
TROCAR BALLN 12MMX100 BLUNT (TROCAR) ×1 IMPLANT
TROCAR Z-THREAD BLADED 5X100MM (TROCAR) ×2 IMPLANT
WATER STERILE IRR 1000ML POUR (IV SOLUTION) ×1 IMPLANT

## 2012-07-29 NOTE — MAU Note (Signed)
Pt reports sharp lower abd pain since 4 am. Hurts to walk. LMP 06/21/2012. Positive preg test at home. Vaginal bleeding since this am. Spotting now.

## 2012-07-29 NOTE — Anesthesia Preprocedure Evaluation (Addendum)
Anesthesia Evaluation  Patient identified by MRN, date of birth, ID band Patient awake    Reviewed: Allergy & Precautions, H&P , NPO status , Patient's Chart, lab work & pertinent test results, reviewed documented beta blocker date and time   History of Anesthesia Complications Negative for: history of anesthetic complications  Airway Mallampati: III TM Distance: >3 FB Neck ROM: full    Dental  (+) Teeth Intact   Pulmonary neg pulmonary ROS, asthma (last inhaler use years ago) ,  breath sounds clear to auscultation  Pulmonary exam normal       Cardiovascular hypertension (on meds x 6 months), On Home Beta Blockers Rhythm:regular Rate:Normal     Neuro/Psych negative neurological ROS  negative psych ROS   GI/Hepatic Neg liver ROS, GERD-  ,  Endo/Other  negative endocrine ROS  Renal/GU negative Renal ROS     Musculoskeletal   Abdominal   Peds  Hematology negative hematology ROS (+)   Anesthesia Other Findings 1 pm chinese food Water at 6 pm Will give pepcid and bicitra  Reproductive/Obstetrics (+) Pregnancy (ectopic pregnacy, 5 weeks)                           Anesthesia Physical Anesthesia Plan  ASA: II and emergent  Anesthesia Plan: General ETT   Post-op Pain Management:    Induction:   Airway Management Planned:   Additional Equipment:   Intra-op Plan:   Post-operative Plan:   Informed Consent: I have reviewed the patients History and Physical, chart, labs and discussed the procedure including the risks, benefits and alternatives for the proposed anesthesia with the patient or authorized representative who has indicated his/her understanding and acceptance.   Dental Advisory Given  Plan Discussed with: CRNA and Surgeon  Anesthesia Plan Comments:        Anesthesia Quick Evaluation

## 2012-07-29 NOTE — MAU Provider Note (Cosign Needed)
History     CSN: [id number]  Arrival date and time: 07/29/12 8413   First Provider Initiated Contact with Patient 07/29/12 2001      Chief Complaint  Patient presents with  . Abdominal Pain  . Vaginal Bleeding   HPI Comments: I have examined the pt and reviewed her [place] Her findings are consistent with a tubal pregnancy. I have advised surgical exploration and possible removal of tubal pregnancy by laparoscopy or laparotomy. She understands and agrees to proceed.   Emily Orozco is a 32 y.o. female @ [redacted]w[redacted]d gestation who presents to MAU with vaginal bleeding.  The bleeding started today. She describes the bleeding as dark and spotting. Associated symptoms include lower abdominal cramping. She rates the pain 10/10 when it comes and then no pan at all. Last pap smear one year ago and was normal. LMP 06/21/12. The history was provided by the patient.  OB History    Grav Para Term Preterm Abortions TAB SAB Ect Mult Living   2 1        1       Past Medical History  Diagnosis Date  . Hypertension     Past Surgical History  Procedure Date  . Cholecystectomy 2009    History reviewed. No pertinent family history.  History  Substance Use Topics  . Smoking status: Never Smoker   . Smokeless tobacco: Not on file  . Alcohol Use: No    Allergies:  Allergies  Allergen Reactions  . Latex Itching  . Penicillins Nausea And Vomiting    Prescriptions prior to admission  Medication Sig Dispense Refill  . ibuprofen (ADVIL,MOTRIN) 200 MG tablet Take 200 mg by mouth every 6 (six) hours as needed. Stomach cramps      . Multiple Vitamin (MULTIVITAMIN WITH MINERALS) TABS Take 1 tablet by mouth daily.      . nebivolol (BYSTOLIC) 5 MG tablet Take 5 mg by mouth daily.        Review of Systems  Constitutional: Positive for malaise/fatigue. Negative for fever, chills and weight loss.  HENT: Negative for ear pain, nosebleeds, congestion and sore throat.   Eyes: Negative for blurred vision,  double vision and photophobia.  Respiratory: Negative for cough and wheezing.   Cardiovascular:       Denies problems   Gastrointestinal: Positive for nausea and abdominal pain. Negative for vomiting, diarrhea and constipation.  Genitourinary: Positive for frequency. Negative for dysuria, urgency (pressure) and flank pain.       Vaginal bleeding  Musculoskeletal: Positive for back pain.  Skin: Negative.   Neurological: Negative for dizziness, seizures and headaches.  Psychiatric/Behavioral: Negative for depression. The patient is not nervous/anxious and does not have insomnia.    Physical Exam   Blood pressure 144/88, pulse 91, temperature 97.9 F (36.6 C), temperature source Oral, resp. rate 18, height 5\' 2"  (1.575 m), weight 169 lb (76.658 kg), last menstrual period 06/21/2012.  Physical Exam  Nursing note and vitals reviewed. Constitutional: She is oriented to person, place, and time. She appears well-developed and well-nourished. No distress.  HENT:  Head: Normocephalic and atraumatic.  Eyes: EOM are normal.  Neck: Neck supple.  Cardiovascular: Normal rate.   Respiratory: Effort normal.  GI: Soft. There is tenderness in the right lower quadrant and left lower quadrant. There is guarding. There is no rebound.  Genitourinary:       External genitalia without lesions. Brown discharge vaginal vault. Cervix closed, long, positive CMT, bilateral adnexal tenderness, right > left.  Uterus without palpable enlargement.  Musculoskeletal: Normal range of motion. She exhibits no edema.  Neurological: She is alert and oriented to person, place, and time.  Skin: Skin is warm and dry.  Psychiatric: She has a normal mood and affect. Her behavior is normal. Judgment and thought content normal.   Results for orders placed during the hospital encounter of 07/29/12 (from the past 24 hour(s))  URINALYSIS, ROUTINE W REFLEX MICROSCOPIC     Status: Abnormal   Collection Time   07/29/12  7:40 PM       Component Value Range   Color, Urine YELLOW  YELLOW   APPearance CLEAR  CLEAR   Specific Gravity, Urine 1.015  1.005 - 1.030   pH 5.5  5.0 - 8.0   Glucose, UA NEGATIVE  NEGATIVE mg/dL   Hgb urine dipstick LARGE (*) NEGATIVE   Bilirubin Urine NEGATIVE  NEGATIVE   Ketones, ur NEGATIVE  NEGATIVE mg/dL   Protein, ur NEGATIVE  NEGATIVE mg/dL   Urobilinogen, UA 0.2  0.0 - 1.0 mg/dL   Nitrite NEGATIVE  NEGATIVE   Leukocytes, UA NEGATIVE  NEGATIVE  URINE MICROSCOPIC-ADD ON     Status: Normal   Collection Time   07/29/12  7:40 PM      Component Value Range   Squamous Epithelial / LPF RARE  RARE   WBC, UA 0-2  <3 WBC/hpf   RBC / HPF 3-6  <3 RBC/hpf   Bacteria, UA RARE  RARE  POCT PREGNANCY, URINE     Status: Abnormal   Collection Time   07/29/12  7:50 PM      Component Value Range   Preg Test, Ur POSITIVE (*) NEGATIVE  CBC WITH DIFFERENTIAL     Status: Abnormal   Collection Time   07/29/12  8:24 PM      Component Value Range   WBC 10.6 (*) 4.0 - 10.5 K/uL   RBC 3.71 (*) 3.87 - 5.11 MIL/uL   Hemoglobin 11.7 (*) 12.0 - 15.0 g/dL   HCT 40.9 (*) 81.1 - 91.4 %   MCV 93.8  78.0 - 100.0 fL   MCH 31.5  26.0 - 34.0 pg   MCHC 33.6  30.0 - 36.0 g/dL   RDW 78.2  95.6 - 21.3 %   Platelets 495 (*) 150 - 400 K/uL   Neutrophils Relative 72  43 - 77 %   Neutro Abs 7.6  1.7 - 7.7 K/uL   Lymphocytes Relative 21  12 - 46 %   Lymphs Abs 2.3  0.7 - 4.0 K/uL   Monocytes Relative 6  3 - 12 %   Monocytes Absolute 0.6  0.1 - 1.0 K/uL   Eosinophils Relative 1  0 - 5 %   Eosinophils Absolute 0.1  0.0 - 0.7 K/uL   Basophils Relative 0  0 - 1 %   Basophils Absolute 0.0  0.0 - 0.1 K/uL  HCG, QUANTITATIVE, PREGNANCY     Status: Abnormal   Collection Time   07/29/12  8:24 PM      Component Value Range   hCG, Beta Chain, Quant, S 2929 (*) <5 mIU/mL  ABO/RH     Status: Normal (Preliminary result)   Collection Time   07/29/12  8:24 PM      Component Value Range   ABO/RH(D) O POS      Procedures US Ob Comp  Less 14 Wks  07/29/2012  *RADIOLOGY REPORT*  Clinical Data: Pain, bleeding, pregnant  OBSTETRIC <14 WK [place] AND  TRANSVAGINAL OB US  Technique:  Both transabdominal and transvaginal ultrasound examinations were performed for complete evaluation of the gestation as well as the maternal uterus, adnexal regions, and pelvic cul-de-sac.  Transvaginal technique was performed to assess early pregnancy.  Comparison:  None.  Intrauterine gestational sac:  Not visualized.  Maternal uterus/adnexae: Left ovary measures 3.9 x 5.55 a 3.2 cm and is notable for a 3.5 cm hemorrhagic cyst and adjacent 2.1 x 1.7 x 2.1 cm complex/hemorrhagic cyst.  Right ovary measures 2.4 x 4.4 x 3.3 cm and is notable for a 1.6 x 2.1 x 1.7 cm simple cyst.  1.6 x 2.6 x 2.1 cm right adnexal mass with gestational sac and yolk sac, likely within the fallopian tube.  IMPRESSION: No IUP is visualized.  Suspected right adnexal ectopic pregnancy with yolk sac.  Critical Value/emergent results were called by telephone at the time of interpretation on 07/29/2012 at 2104 hrs to [facility], the nurse caring for the patient, who verbally acknowledged these results.   Original Report Authenticated By: Charline Bills, M.D.    US Ob Transvaginal  07/29/2012  *RADIOLOGY REPORT*  Clinical Data: Pain, bleeding, pregnant  OBSTETRIC <14 WK [place] AND TRANSVAGINAL OB US  Technique:  Both transabdominal and transvaginal ultrasound examinations were performed for complete evaluation of the gestation as well as the maternal uterus, adnexal regions, and pelvic cul-de-sac.  Transvaginal technique was performed to assess early pregnancy.  Comparison:  None.  Intrauterine gestational sac:  Not visualized.  Maternal uterus/adnexae: Left ovary measures 3.9 x 5.55 a 3.2 cm and is notable for a 3.5 cm hemorrhagic cyst and adjacent 2.1 x 1.7 x 2.1 cm complex/hemorrhagic cyst.  Right ovary measures 2.4 x 4.4 x 3.3 cm and is notable for a 1.6 x 2.1 x 1.7 cm simple cyst.  1.6 x 2.6 x 2.1 cm  right adnexal mass with gestational sac and yolk sac, likely within the fallopian tube.  IMPRESSION: No IUP is visualized.  Suspected right adnexal ectopic pregnancy with yolk sac.  Critical Value/emergent results were called by telephone at the time of interpretation on 07/29/2012 at 2104 hrs to [facility], the nurse caring for the patient, who verbally acknowledged these results.   Original Report Authenticated By: Charline Bills, M.D.     When patient returned form ultrasound her pain has increased.  Assessment: 32 y.o. female with ectopic pregnancy  Plan:  Discussed with Dr. Ambrose Mantle and he will evaluate the patient in MAU   IVLR @ 125 cc/hr.   NEESE,HOPE,RN, FNP, BC 07/29/2012, 8:02 PM

## 2012-07-30 ENCOUNTER — Encounter (HOSPITAL_COMMUNITY): Payer: Self-pay | Admitting: *Deleted

## 2012-07-30 LAB — CBC
HCT: 32.8 % — ABNORMAL LOW (ref 36.0–46.0)
Hemoglobin: 10.9 g/dL — ABNORMAL LOW (ref 12.0–15.0)
MCHC: 33.2 g/dL (ref 30.0–36.0)
RBC: 3.5 MIL/uL — ABNORMAL LOW (ref 3.87–5.11)

## 2012-07-30 LAB — GC/CHLAMYDIA PROBE AMP: GC Probe RNA: NEGATIVE

## 2012-07-30 MED ORDER — GLYCOPYRROLATE 0.2 MG/ML IJ SOLN
INTRAMUSCULAR | Status: DC | PRN
  Administered 2012-07-30: .5 mg via INTRAVENOUS

## 2012-07-30 MED ORDER — ONDANSETRON HCL 4 MG/2ML IJ SOLN: 4.0000 mg | Freq: Four times a day (QID) | INTRAMUSCULAR | Status: DC | PRN

## 2012-07-30 MED ORDER — BUTORPHANOL TARTRATE 1 MG/ML IJ SOLN
1.0000 mg | INTRAMUSCULAR | Status: DC | PRN
  Administered 2012-07-30: 1 mg via INTRAVENOUS
  Filled 2012-07-30: qty 1

## 2012-07-30 MED ORDER — IBUPROFEN 600 MG PO TABS: 600.0000 mg | ORAL_TABLET | Freq: Four times a day (QID) | ORAL | Status: DC | PRN

## 2012-07-30 MED ORDER — METOCLOPRAMIDE HCL 5 MG/ML IJ SOLN: 10.0000 mg | Freq: Once | INTRAMUSCULAR | Status: DC | PRN

## 2012-07-30 MED ORDER — OXYCODONE-ACETAMINOPHEN 5-325 MG PO TABS: 1.0000 | ORAL_TABLET | Freq: Four times a day (QID) | ORAL | Status: DC | PRN

## 2012-07-30 MED ORDER — ONDANSETRON HCL 4 MG PO TABS
4.0000 mg | ORAL_TABLET | Freq: Four times a day (QID) | ORAL | Status: DC | PRN
  Administered 2012-07-30: 4 mg via ORAL
  Filled 2012-07-30: qty 1

## 2012-07-30 MED ORDER — ONDANSETRON HCL 4 MG/2ML IJ SOLN
INTRAMUSCULAR | Status: DC | PRN
  Administered 2012-07-30: 4 mg via INTRAVENOUS

## 2012-07-30 MED ORDER — NEOSTIGMINE METHYLSULFATE 1 MG/ML IJ SOLN
INTRAMUSCULAR | Status: DC | PRN
  Administered 2012-07-30: 2.5 mg via INTRAVENOUS

## 2012-07-30 MED ORDER — HYDROMORPHONE HCL PF 1 MG/ML IJ SOLN
0.2500 mg | INTRAMUSCULAR | Status: DC | PRN
  Administered 2012-07-30 (×2): 0.5 mg via INTRAVENOUS

## 2012-07-30 NOTE — Anesthesia Postprocedure Evaluation (Signed)
  Anesthesia Post-op Note  Patient: Emily Orozco  Procedure(s) Performed: Procedure(s) (LRB) with comments: LAPAROSCOPY OPERATIVE (Right) - Evacuation Hemoperitoneum, Right Salpingectomy   Patient Location: PACU and Women's Unit  Anesthesia Type:General  Level of Consciousness: awake, alert , oriented and patient cooperative  Airway and Oxygen Therapy: Patient Spontanous Breathing  Post-op Pain: none  Post-op Assessment: Post-op Vital signs reviewed and Patient's Cardiovascular Status Stable  Post-op Vital Signs: Reviewed and stable  Complications: No apparent anesthesia complications

## 2012-07-30 NOTE — Addendum Note (Signed)
Addendum  created 07/30/12 1013 by Orlie Pollen, CRNA   Modules edited:Notes Section

## 2012-07-30 NOTE — Progress Notes (Signed)
Patient ID: Emily Orozco, female   DOB: 20-Dec-1980, 32 y.o.   MRN: [id number] Afebrile BP normal HGB stable Output good Abdomen soft not tender For d/c

## 2012-07-30 NOTE — Op Note (Signed)
Emily Orozco, Wuellner Talon               ACCOUNT NO.:  [id number]  MEDICAL RECORD NO.:  [id number]  LOCATION:  [facility]                          [facility]  [facility]  PHYSICIAN:  Malachi Pro. Ambrose Mantle, M.D. DATE OF BIRTH:  March 07, 1981  DATE OF PROCEDURE:  07/30/2012 DATE OF DISCHARGE:                              OPERATIVE REPORT   PREOPERATIVE DIAGNOSIS:  Right tubal pregnancy.  POSTOPERATIVE DIAGNOSIS:  Right tubal pregnancy.  OPERATION:  Diagnostic laparoscopy, evacuation of hemoperitoneum, right salpingectomy.  OPERATOR:  Malachi Pro. Ambrose Mantle, M.D.  ANESTHESIA:  General anesthesia.  DESCRIPTION OF PROCEDURE:  The patient was brought to the operating room and given satisfactory general anesthesia by Dr. Rodman Pickle.  She was placed in the [place] stirrups with her legs slightly flexed.  The abdomen was prepped with Betadine solution.  The vagina and urethra were prepped.  A Foley catheter was inserted to straight drain.  A time-out was done.  The abdomen was then draped as a sterile field.  A small incision was made in the inferior portion of the umbilicus.  There was scarring here from the previous cholecystectomy.  I went down to the fascia, grasped the fascia with Kocher clamps, and entered the peritoneal cavity.  I used a Hasson cannula to enter the peritoneal cavity, blew up the balloon after doing a pursestring suture of 0 Vicryl with a UR 6 needle.  I visualized the abdomen after inflating with CO2 and found a hemoperitoneum estimated at 175 mL after suctioning it out. I placed a accessory trocar suprapubically in the midline under direct vision and then placed 1 in the right lateral abdomen about 1 cm medial to the iliac crest.  I then identified all the pelvic structures and placed the patient in Trendelenburg.  The uterus was anterior and normal size.  The cul-de-sac had been full of blood.  After it was suctioned out, the cul-de-sac was normal.  The left tube and ovary appeared normal except  the left ovary was enlarged with what appeared to be a clear cyst.  The anterior cul-de-sac was normal.  The fimbria of the left tube was normal.  The right tube had what appeared to be a tubal pregnancy. The proximal portion seemed to be normal, but then it enlarged into what appeared to be a tubal pregnancy with lot of blood clot around it that had been suctioned out.  The right ovary was completely normal.  I visualized the upper abdomen briefly and did not see any active bleeding.  Using the right lateral port for the Kleppinger forceps and a atraumatic grasper through the lower port, sequentially cauterized the mesial salpinx actually starting at the proximal end of the tube and then starting again at the distal end and after I had cauterized all what appeared to be all the mesosalpinx.  I cut it with scissors.  There was no bleeding.  I then used a 5 mm camera through the right port, inserted the bag through the umbilical port and placed the specimen in the bag and tied the pursestring suture in the umbilicus to get the specimen out, preserved the specimen, reinflated the abdomen with CO2, cauterized what appeared to be the segments  of mesosalpinx that I had cauterized and cut earlier, found no bleeding.  I visualized the right ureter.  I never saw it actually peristalse but I felt like it was well below the operative field.  I liberally irrigated so that there was no blood left in the abdomen.  I inspected both trocar sites suprapubically and in the right lateral abdomen for any bleeding.  After the trocars were removed, there was no bleeding.  I removed the Vicryl suture in the umbilicus and replaced it with a 0 PDS suture, tied it down and then closed the skin of the umbilicus with interrupted 3-0 plain catgut and placed Steri-Strips across the other incisions.  The patient seemed to tolerate the procedure well.  I do not think there was any blood loss during the procedure, but we  estimated the hemoperitoneum at 175 mL. At the end of the procedure, the uterus appeared normal.  The left tube was normal.  The left ovary had what appeared to be a clear cyst.  The right ovary was completely normal in size and the right tube was absent.  The patient was returned to recovery in satisfactory condition after the [place]ka cannula was removed, and I inspected the cervix for any bleeding and there was no bleeding.     Malachi Pro. Ambrose Mantle, M.D.     TFH/MEDQ  D:  07/30/2012  T:  07/30/2012  Job:  [id number]

## 2012-07-30 NOTE — Progress Notes (Signed)
Pt d/c home medicated for nausea earlier  No emesis noted   Pt out in wheelchair

## 2012-07-30 NOTE — Discharge Summary (Signed)
NAMELucresha, Emily Orozco               ACCOUNT NO.:  [id number]  MEDICAL RECORD NO.:  [id number]  LOCATION:  9304                          FACILITY:  [facility]  PHYSICIAN:  Malachi Pro. Ambrose Mantle, M.D. DATE OF BIRTH:  May 03, 1981  DATE OF ADMISSION:  07/29/2012 DATE OF DISCHARGE:                              DISCHARGE SUMMARY   This is a 32 year old white female, para 1-0-0-1, gravida 2 with last menstrual period June 21, 2012, who was admitted to the hospital after a laparoscopic removal of right tube contained a tubal pregnancy. The patient presented to the emergency room with severe lower abdominal pain that radiated to her vagina and rectum with vaginal bleeding. Ultrasound strongly suggestive of a right tubal pregnancy.  Quantitative hCG of 2929 and no intrauterine gestational sac.  After surgery, the patient did well and is discharged later in the morning of the day of surgery.  At the time of discharge, she is afebrile.  Her pulse is normal.  Hemoglobin and hematocrit were stable.  Her abdomen is soft, only slightly tender.  Blood group and type O positive, and she is ready for discharge.  LABORATORY DATA:  On admission, hemoglobin 11.7, hematocrit 34.8, white count 10,600, platelet count 495,000, normal differential.  Blood group and type O positive.  Pregnancy test positive.  hCG 2929.  Followup hemoglobin, hemoglobin 10.9, hematocrit 32.8, white count 11, 900, platelet count of 490,000.  The patient will be observed for voiding. If voiding is not done, then she will be sent home with a catheter.  Her ultrasound done the night of the admission revealed no intrauterine pregnancy, suspected right tubal pregnancy with a yolk sac.  The left ovary measured 3.9 x 5.55 x 3.2 cm and a 3.5 cm hemorrhagic cyst.  The right ovary was normal size.  There was a 1.6 x 2.6 x 2.1 cm right adnexal mass with gestational sac and yolk sac, likely within the fallopian tube.  No mention was made of free  fluid.  FINAL DIAGNOSIS:  Right tubal pregnancy with hemoperitoneum.  OPERATION:  Diagnostic laparoscopy, removal of right fallopian tube and evacuation of hemoperitoneum.  FINAL CONDITION:  Improved.  INSTRUCTIONS:  Include our regular discharge instructions.  No vaginal entrance.  No heavy lifting or strenuous activity.  Call with fever above 100.4 degrees.  Call with increasing pain, or inability to tolerate oral intake.  She is to return to the office in 1 week for followup examination.  DISCHARGE MEDICATIONS:  Resume her antihypertensive therapy.  Motrin 600 mg 30 tablets, 1 every 6 hours as needed for pain and Percocet 5/325, 30 tablets, 1 every 6 hours as needed for pain.  Return to our office in 1 week for followup examination.     Malachi Pro. Ambrose Mantle, M.D.     TFH/MEDQ  D:  07/30/2012  T:  07/30/2012  Job:  [id number]

## 2012-07-30 NOTE — Transfer of Care (Signed)
Immediate Anesthesia Transfer of Care Note  Patient: Emily Orozco  Procedure(s) Performed: Procedure(s) (LRB) with comments: LAPAROSCOPY OPERATIVE (Right) - Evacuation Hemoperitoneum, Right Salpingectomy   Patient Location: PACU  Anesthesia Type:General  Level of Consciousness: sedated  Airway & Oxygen Therapy: Patient Spontanous Breathing and Patient connected to nasal cannula oxygen  Post-op Assessment: Report given to PACU RN and Post -op Vital signs reviewed and stable  Post vital signs: stable  Complications: No apparent anesthesia complications

## 2012-07-30 NOTE — Anesthesia Postprocedure Evaluation (Signed)
Anesthesia Post Note  Patient: Emily Orozco  Procedure(s) Performed: Procedure(s) (LRB): LAPAROSCOPY OPERATIVE (Right)  Anesthesia type: General  Patient location: PACU  Post pain: Pain level controlled  Post assessment: Post-op Vital signs reviewed  Last Vitals:  Filed Vitals:   07/30/12 0209  BP: 133/78  Pulse: 79  Temp: 36.4 C  Resp: 16    Post vital signs: Reviewed  Level of consciousness: sedated  Complications: No apparent anesthesia complications

## 2012-07-31 ENCOUNTER — Encounter (HOSPITAL_COMMUNITY): Payer: Self-pay | Admitting: Obstetrics and Gynecology

## 2013-06-03 ENCOUNTER — Encounter (HOSPITAL_COMMUNITY): Payer: Self-pay | Admitting: *Deleted

## 2013-09-26 ENCOUNTER — Encounter: Payer: Self-pay | Admitting: Neurology

## 2013-09-26 ENCOUNTER — Encounter (HOSPITAL_COMMUNITY)
Admission: RE | Admit: 2013-09-26 | Discharge: 2013-09-26 | Disposition: A | Discharge status: Home / Self Care | Payer: 59 | Source: Ambulatory Visit | Attending: Obstetrics and Gynecology | Admitting: Obstetrics and Gynecology

## 2013-09-26 ENCOUNTER — Encounter (HOSPITAL_COMMUNITY): Payer: Self-pay

## 2013-09-26 DIAGNOSIS — Z01812 Encounter for preprocedural laboratory examination: Secondary | ICD-10-CM | POA: Insufficient documentation

## 2013-09-26 HISTORY — DX: Malignant (primary) neoplasm, unspecified: C80.1

## 2013-09-26 HISTORY — DX: Unspecified asthma, uncomplicated: J45.909

## 2013-09-26 HISTORY — DX: Bronchitis, not specified as acute or chronic: J40

## 2013-09-26 HISTORY — DX: Unspecified ectopic pregnancy without intrauterine pregnancy: O00.90

## 2013-09-26 LAB — CBC
HCT: 36.3 % (ref 36.0–46.0)
HEMOGLOBIN: 12.6 g/dL (ref 12.0–15.0)
MCH: 32 pg (ref 26.0–34.0)
MCHC: 34.7 g/dL (ref 30.0–36.0)
MCV: 92.1 fL (ref 78.0–100.0)
PLATELETS: 472 10*3/uL — AB (ref 150–400)
RBC: 3.94 MIL/uL (ref 3.87–5.11)
RDW: 12.3 % (ref 11.5–15.5)
WBC: 7.8 10*3/uL (ref 4.0–10.5)

## 2013-09-26 LAB — BASIC METABOLIC PANEL
BUN: 6 mg/dL (ref 6–23)
CO2: 28 mEq/L (ref 19–32)
Calcium: 9.2 mg/dL (ref 8.4–10.5)
Chloride: 97 mEq/L (ref 96–112)
Creatinine, Ser: 0.77 mg/dL (ref 0.50–1.10)
Glucose, Bld: 89 mg/dL (ref 70–99)
POTASSIUM: 3.8 meq/L (ref 3.7–5.3)
SODIUM: 136 meq/L — AB (ref 137–147)

## 2013-09-26 NOTE — Patient Instructions (Signed)
Your procedure is scheduled on: Monday, October 06, 2013  Enter through the [employer] [facility] at: 6:00am  Pick up the phone at the desk and dial [phone].  Call this number if you have problems the morning of surgery: [phone].  Remember: Do NOT eat food: After midnight Sunday Do NOT drink clear liquids after: After midnight Sunday Take these medicines the morning of surgery with a SIP OF WATER: Norvasc  Do NOT wear jewelry (body piercing), metal hair clips/bobby pins, make-up, or nail polish. Do NOT wear lotions, powders, or perfumes.  You may wear deoderant. Do NOT shave for 48 hours prior to surgery. Do NOT bring valuables to the hospital. Contacts, dentures, or bridgework may not be worn into surgery. Have a responsible adult drive you home and stay with you for 24 hours after your procedure

## 2013-10-05 ENCOUNTER — Encounter (HOSPITAL_COMMUNITY): Payer: Self-pay | Admitting: Obstetrics and Gynecology

## 2013-10-05 DIAGNOSIS — Z302 Encounter for sterilization: Secondary | ICD-10-CM

## 2013-10-05 HISTORY — DX: Encounter for sterilization: Z30.2

## 2013-10-05 NOTE — H&P (Signed)
Emily Orozco is an 33 y.o. female.[id number] s/p ectopic pregnancy and R salpingectomy with undesired fertility. D/W pt r/b/a and various options for ligation.  Pt desires fulguration.    Pertinent Gynecological History: G2P1011  G1 SVD 6#11oz, female G2 ectopic - salpingectomy  PCOS Pap WNL HR HPV neg 2012,h/o cryo No STDs  Menstrual History:  No LMP recorded.      Past Medical History  Diagnosis Date  . Hypertension   . Asthma   . Bronchitis   . Ectopic pregnancy   . Headache(784.0)     migraines occ  . 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 IBS (irritable bowel syndrome)   . Obesity, unspecified   . Encounter for tubal ligation 10/05/2013    Past Surgical History  Procedure Laterality Date  . Cholecystectomy  2009  . Laparoscopy  07/29/2012    Procedure: LAPAROSCOPY OPERATIVE;  Surgeon: Melina Schools, MD;  Location: [facility];  Service: Gynecology;  Laterality: Right;  Evacuation Hemoperitoneum, Right Salpingectomy   . Cryoablation      Family History  Problem Relation Age of Onset  . Leukemia Father   . Migraines Brother     Social History:  reports that she has never smoked. She has never used smokeless tobacco. 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 call center, married  Allergies:  Allergies  Allergen Reactions  . Toradol [Ketorolac Tromethamine] Anaphylaxis    SOB increased heart rate eyes rolled back into head  . Latex Itching  . Penicillins Nausea And Vomiting   Meds: Amlodipine Orsythia Prevident   Review of Systems  Constitutional: Negative.   HENT: Negative.   Eyes: Negative.   Respiratory: Negative.   Cardiovascular: Negative.   Gastrointestinal: Negative.   Musculoskeletal: Negative.   Skin: Negative.   Neurological: Negative.   Psychiatric/Behavioral: Negative.     unknown if currently breastfeeding. Physical Exam  Constitutional: She is oriented to person, place, and time. She appears  well-developed and well-nourished.  HENT:  Head: Normocephalic and atraumatic.  Cardiovascular: Normal rate and regular rhythm.   Respiratory: Effort normal and breath sounds normal. No respiratory distress. She has no wheezes.  GI: Soft. Bowel sounds are normal. She exhibits no distension. There is no tenderness.  Musculoskeletal: Normal range of motion.  Neurological: She is alert and oriented to person, place, and time.  Skin: Skin is warm and dry.  Psychiatric: She has a normal mood and affect. Her behavior is normal.    Assessment/Plan: 33yo G2P1011 for fulguration of remaining tube.  D/W pt r/b/a of surgery - wishes to proceed.    Stuckert, Toivo Bordon Bovard 10/05/2013, 9:09 PM

## 2013-10-06 ENCOUNTER — Encounter (HOSPITAL_COMMUNITY)
Admission: RE | Disposition: A | Discharge status: Home / Self Care | Payer: Self-pay | Source: Ambulatory Visit | Attending: Obstetrics and Gynecology

## 2013-10-06 ENCOUNTER — Encounter (HOSPITAL_COMMUNITY): Payer: 59 | Admitting: Anesthesiology

## 2013-10-06 ENCOUNTER — Encounter (HOSPITAL_COMMUNITY): Payer: Self-pay | Admitting: Anesthesiology

## 2013-10-06 ENCOUNTER — Ambulatory Visit (HOSPITAL_COMMUNITY)
Admission: RE | Admit: 2013-10-06 | Discharge: 2013-10-06 | Disposition: A | Discharge status: Home / Self Care | Payer: 59 | Source: Ambulatory Visit | Attending: Obstetrics and Gynecology | Admitting: Obstetrics and Gynecology

## 2013-10-06 ENCOUNTER — Ambulatory Visit (HOSPITAL_COMMUNITY): Payer: 59 | Admitting: Anesthesiology

## 2013-10-06 DIAGNOSIS — J45909 Unspecified asthma, uncomplicated: Secondary | ICD-10-CM | POA: Insufficient documentation

## 2013-10-06 DIAGNOSIS — Z302 Encounter for sterilization: Secondary | ICD-10-CM

## 2013-10-06 DIAGNOSIS — I10 Essential (primary) hypertension: Secondary | ICD-10-CM | POA: Insufficient documentation

## 2013-10-06 DIAGNOSIS — K589 Irritable bowel syndrome without diarrhea: Secondary | ICD-10-CM | POA: Insufficient documentation

## 2013-10-06 HISTORY — PX: LAPAROSCOPIC TUBAL LIGATION: SHX1937

## 2013-10-06 HISTORY — DX: Encounter for sterilization: Z30.2

## 2013-10-06 LAB — PREGNANCY, URINE: PREG TEST UR: NEGATIVE

## 2013-10-06 SURGERY — LIGATION, FALLOPIAN TUBE, LAPAROSCOPIC
Anesthesia: General | Laterality: Left

## 2013-10-06 MED ORDER — GLYCOPYRROLATE 0.2 MG/ML IJ SOLN
INTRAMUSCULAR | Status: DC | PRN
  Administered 2013-10-06: .8 mg via INTRAVENOUS

## 2013-10-06 MED ORDER — BUPIVACAINE HCL (PF) 0.25 % IJ SOLN
INTRAMUSCULAR | Status: AC
  Filled 2013-10-06: qty 30

## 2013-10-06 MED ORDER — ROCURONIUM BROMIDE 100 MG/10ML IV SOLN
INTRAVENOUS | Status: DC | PRN
  Administered 2013-10-06: 40 mg via INTRAVENOUS

## 2013-10-06 MED ORDER — BUPIVACAINE HCL (PF) 0.25 % IJ SOLN
INTRAMUSCULAR | Status: DC | PRN
  Administered 2013-10-06: 10 mL

## 2013-10-06 MED ORDER — DEXAMETHASONE SODIUM PHOSPHATE 10 MG/ML IJ SOLN
INTRAMUSCULAR | Status: AC
  Filled 2013-10-06: qty 1

## 2013-10-06 MED ORDER — PROPOFOL 10 MG/ML IV BOLUS
INTRAVENOUS | Status: DC | PRN
  Administered 2013-10-06: 50 mg via INTRAVENOUS
  Administered 2013-10-06: 150 mg via INTRAVENOUS

## 2013-10-06 MED ORDER — MIDAZOLAM HCL 2 MG/2ML IJ SOLN
INTRAMUSCULAR | Status: AC
  Filled 2013-10-06: qty 2

## 2013-10-06 MED ORDER — FENTANYL CITRATE 0.05 MG/ML IJ SOLN
INTRAMUSCULAR | Status: AC
  Administered 2013-10-06: 50 ug via INTRAVENOUS
  Filled 2013-10-06: qty 2

## 2013-10-06 MED ORDER — OXYCODONE-ACETAMINOPHEN 5-325 MG PO TABS: 1.0000 | ORAL_TABLET | Freq: Four times a day (QID) | ORAL | Status: DC | PRN

## 2013-10-06 MED ORDER — FENTANYL CITRATE 0.05 MG/ML IJ SOLN
25.0000 ug | INTRAMUSCULAR | Status: DC | PRN
  Administered 2013-10-06 (×2): 50 ug via INTRAVENOUS

## 2013-10-06 MED ORDER — ONDANSETRON HCL 4 MG/2ML IJ SOLN
INTRAMUSCULAR | Status: DC | PRN
  Administered 2013-10-06: 4 mg via INTRAVENOUS

## 2013-10-06 MED ORDER — MEPERIDINE HCL 25 MG/ML IJ SOLN: 6.2500 mg | INTRAMUSCULAR | Status: DC | PRN

## 2013-10-06 MED ORDER — KETOROLAC TROMETHAMINE 30 MG/ML IJ SOLN
INTRAMUSCULAR | Status: AC
  Filled 2013-10-06: qty 1

## 2013-10-06 MED ORDER — FENTANYL CITRATE 0.05 MG/ML IJ SOLN
INTRAMUSCULAR | Status: AC
  Filled 2013-10-06: qty 5

## 2013-10-06 MED ORDER — SODIUM CHLORIDE 0.9 % IJ SOLN
INTRAMUSCULAR | Status: DC | PRN
  Administered 2013-10-06: 10 mL

## 2013-10-06 MED ORDER — ONDANSETRON HCL 4 MG/2ML IJ SOLN
4.0000 mg | Freq: Once | INTRAMUSCULAR | Status: AC | PRN
  Administered 2013-10-06: 4 mg via INTRAVENOUS

## 2013-10-06 MED ORDER — LIDOCAINE HCL (CARDIAC) 20 MG/ML IV SOLN
INTRAVENOUS | Status: DC | PRN
  Administered 2013-10-06: 80 mg via INTRAVENOUS

## 2013-10-06 MED ORDER — LACTATED RINGERS IV SOLN
INTRAVENOUS | Status: DC
  Administered 2013-10-06 (×2): via INTRAVENOUS

## 2013-10-06 MED ORDER — NEOSTIGMINE METHYLSULFATE 1 MG/ML IJ SOLN
INTRAMUSCULAR | Status: DC | PRN
  Administered 2013-10-06: 4 mg via INTRAVENOUS

## 2013-10-06 MED ORDER — ONDANSETRON HCL 4 MG/2ML IJ SOLN
INTRAMUSCULAR | Status: AC
  Filled 2013-10-06: qty 2

## 2013-10-06 MED ORDER — OXYCODONE-ACETAMINOPHEN 5-325 MG PO TABS
ORAL_TABLET | ORAL | Status: AC
  Filled 2013-10-06: qty 1

## 2013-10-06 MED ORDER — MIDAZOLAM HCL 2 MG/2ML IJ SOLN
INTRAMUSCULAR | Status: DC | PRN
Start: 2013-10-06 — End: 2013-10-06
  Administered 2013-10-06: 2 mg via INTRAVENOUS

## 2013-10-06 MED ORDER — LIDOCAINE HCL (CARDIAC) 20 MG/ML IV SOLN
INTRAVENOUS | Status: AC
  Filled 2013-10-06: qty 5

## 2013-10-06 MED ORDER — GLYCOPYRROLATE 0.2 MG/ML IJ SOLN
INTRAMUSCULAR | Status: AC
  Filled 2013-10-06: qty 3

## 2013-10-06 MED ORDER — ROCURONIUM BROMIDE 100 MG/10ML IV SOLN
INTRAVENOUS | Status: AC
  Filled 2013-10-06: qty 1

## 2013-10-06 MED ORDER — OXYCODONE-ACETAMINOPHEN 5-325 MG PO TABS
1.0000 | ORAL_TABLET | ORAL | Status: DC | PRN
  Administered 2013-10-06: 1 via ORAL

## 2013-10-06 MED ORDER — SODIUM CHLORIDE 0.9 % IJ SOLN
INTRAMUSCULAR | Status: AC
  Filled 2013-10-06: qty 10

## 2013-10-06 MED ORDER — PROPOFOL 10 MG/ML IV EMUL
INTRAVENOUS | Status: AC
  Filled 2013-10-06: qty 20

## 2013-10-06 MED ORDER — FENTANYL CITRATE 0.05 MG/ML IJ SOLN
INTRAMUSCULAR | Status: DC | PRN
  Administered 2013-10-06: 100 ug via INTRAVENOUS
  Administered 2013-10-06: 50 ug via INTRAVENOUS

## 2013-10-06 MED ORDER — NEOSTIGMINE METHYLSULFATE 1 MG/ML IJ SOLN
INTRAMUSCULAR | Status: AC
  Filled 2013-10-06: qty 1

## 2013-10-06 MED ORDER — GLYCOPYRROLATE 0.2 MG/ML IJ SOLN
INTRAMUSCULAR | Status: AC
  Filled 2013-10-06: qty 1

## 2013-10-06 MED ORDER — DEXAMETHASONE SODIUM PHOSPHATE 10 MG/ML IJ SOLN
INTRAMUSCULAR | Status: DC | PRN
  Administered 2013-10-06: 10 mg via INTRAVENOUS

## 2013-10-06 SURGICAL SUPPLY — 26 items
ADH SKN CLS APL DERMABOND .7 (GAUZE/BANDAGES/DRESSINGS) ×1
BAG SPEC RTRVL LRG 6X4 10 (ENDOMECHANICALS)
CATH ROBINSON RED A/P 16FR (CATHETERS) ×3 IMPLANT
CHLORAPREP W/TINT 26ML (MISCELLANEOUS) ×3 IMPLANT
CLOTH BEACON ORANGE TIMEOUT ST (SAFETY) ×3 IMPLANT
DECANTER SPIKE VIAL GLASS SM (MISCELLANEOUS) ×6 IMPLANT
DERMABOND ADVANCED (GAUZE/BANDAGES/DRESSINGS) ×2
DERMABOND ADVANCED .7 DNX12 (GAUZE/BANDAGES/DRESSINGS) ×1 IMPLANT
GLOVE BIO SURGEON STRL SZ 6.5 (GLOVE) ×2 IMPLANT
GLOVE BIO SURGEON STRL SZ7 (GLOVE) ×3 IMPLANT
GLOVE BIO SURGEONS STRL SZ 6.5 (GLOVE) ×1
GOWN STRL REUS W/TWL LRG LVL3 (GOWN DISPOSABLE) ×6 IMPLANT
NEEDLE INSUFFLATION 120MM (ENDOMECHANICALS) ×3 IMPLANT
PACK LAPAROSCOPY BASIN (CUSTOM PROCEDURE TRAY) ×3 IMPLANT
POUCH SPECIMEN RETRIEVAL 10MM (ENDOMECHANICALS) IMPLANT
PROTECTOR NERVE ULNAR (MISCELLANEOUS) ×6 IMPLANT
SHEARS HARMONIC ACE PLUS 36CM (ENDOMECHANICALS) ×3 IMPLANT
SUT VIC AB 3-0 CTX 36 (SUTURE) IMPLANT
SUT VIC AB 3-0 PS2 18 (SUTURE) ×3
SUT VIC AB 3-0 PS2 18XBRD (SUTURE) ×1 IMPLANT
SUT VICRYL 0 UR6 27IN ABS (SUTURE) IMPLANT
TOWEL OR 17X24 6PK STRL BLUE (TOWEL DISPOSABLE) ×6 IMPLANT
TROCAR XCEL NON-BLD 11X100MML (ENDOMECHANICALS) ×3 IMPLANT
TROCAR XCEL NON-BLD 5MMX100MML (ENDOMECHANICALS) IMPLANT
WARMER LAPAROSCOPE (MISCELLANEOUS) ×3 IMPLANT
WATER STERILE IRR 1000ML POUR (IV SOLUTION) ×3 IMPLANT

## 2013-10-06 NOTE — Op Note (Signed)
NAMEMikka, Emily Orozco               ACCOUNT NO.:  [id number]  MEDICAL RECORD NO.:  [id number]  LOCATION:  [facility]                          FACILITY:  [facility]  PHYSICIAN:  Thornell Sartorius, MD        DATE OF BIRTH:  31-Mar-1981  DATE OF PROCEDURE:  10/06/2013 DATE OF DISCHARGE:                              OPERATIVE REPORT   PREOPERATIVE DIAGNOSIS:  Undesired fertility.  POSTOPERATIVE DIAGNOSIS:  Undesired fertility.  PROCEDURE:  Operative laparoscopy, fulguration of the left fallopian tube.  SURGEON:  Thornell Sartorius, MD  ANESTHESIA:  General and 10 mL of 0.25% Marcaine as local.  EBL:  5 mL.  URINE OUTPUT:  200 mL clear urine at the beginning of the procedure by I and O cath.  IV FLUIDS:  1200 mL.  COMPLICATIONS:  None.  PATHOLOGY:  None.  PROCEDURE:  After informed consent was reviewed with the patient and her husband, she was transported to the operating room in stable condition, placed on the table in supine position.  General anesthesia was induced and found to be adequate.  She was then placed in the yellow-fin stirrups, prepped and draped in the normal sterile fashion.  Her bladder was sterilely drained.  Using an open-sided speculum and a single-tooth tenaculum, a uterine manipulator was placed into her uterus.  Gloves were changed.  Attention was turned to the abdominal portion of the case at the level of her previous laparoscopy incision.  An incision was made.   Using the Veress needle, pneumoperitoneum was obtained after passing the hanging drop test.  Pneumoperitoneum was obtained.  There was some omentum scarred at the level of this incision making entry mildly difficult.  It was carefully inspected and no bleeding was noted.  A pelvic survey revealed a missing right tube which was noted secondary to right salpingectomy last year.  The left tube was identified, followed out to the fimbriated, normal. The tube was ligated with fulguration in the typical total burn  technique with the ohmmeter. Pneumoperitoneum was evacuated.  The instruments were removed.  The operative scope had been used at the umbilicus. The fascia was reapproximated with 0 vicryl.  The skin closed in a subcuticular fashion with 4-0 vicryl and dermabond.  The patient tolerated the procedure well.  Sponge, lap, and needle count was correct x2.     Thornell Sartorius, MD     JB/MEDQ  D:  10/06/2013  T:  10/06/2013  Job:  [id number]

## 2013-10-06 NOTE — Transfer of Care (Signed)
Immediate Anesthesia Transfer of Care Note  Patient: Emily Orozco  Procedure(s) Performed: Procedure(s): Opertive Laparoscopy; Fulguration of Left Fallopian Tube (Left)  Patient Location: PACU  Anesthesia Type:General  Level of Consciousness: awake, alert  and oriented  Airway & Oxygen Therapy: Patient Spontanous Breathing and Patient connected to nasal cannula oxygen  Post-op Assessment: Report given to PACU RN, Post -op Vital signs reviewed and stable and Patient moving all extremities  Post vital signs: Reviewed and stable  Complications: No apparent anesthesia complications

## 2013-10-06 NOTE — Brief Op Note (Signed)
10/06/2013  8:33 AM  PATIENT:  Emily Orozco  33 y.o. female  PRE-OPERATIVE DIAGNOSIS:  Sterilization  POST-OPERATIVE DIAGNOSIS:  Sterilization  PROCEDURE:  Procedure(s): Opertive Laparoscopy; Fulguration of Left Fallopian Tube (Left)  SURGEON:  Surgeon(s) and Role:    * Damiano Stamper Bovard Stuckert, MD - Primary  ANESTHESIA:   local and general  EBL:  Total I/O In: 1200 [I.V.:1200] Out: 205 [Urine:200; Blood:5]  BLOOD ADMINISTERED:none  DRAINS: none   LOCAL MEDICATIONS USED:  MARCAINE  0.25%  and Amount: 10 ml  SPECIMEN:  No Specimen  DISPOSITION OF SPECIMEN:  N/A  COUNTS:  YES  TOURNIQUET:  * No tourniquets in log *  DICTATION: .Other Dictation: Dictation Number [id number]  PLAN OF CARE: Discharge to home after PACU  PATIENT DISPOSITION:  PACU - hemodynamically stable.   Delay start of Pharmacological VTE agent (>24hrs) due to surgical blood loss or risk of bleeding: not applicable

## 2013-10-06 NOTE — Anesthesia Preprocedure Evaluation (Addendum)
Anesthesia Evaluation  Patient identified by MRN, date of birth, ID band Patient awake    Reviewed: Allergy & Precautions, H&P , NPO status , Patient's Chart, lab work & pertinent test results  Airway Mallampati: I TM Distance: >3 FB Neck ROM: full    Dental no notable dental hx.    Pulmonary    Pulmonary exam normal       Cardiovascular hypertension, Pt. on medications     Neuro/Psych negative psych ROS   GI/Hepatic negative GI ROS, Neg liver ROS,   Endo/Other  negative endocrine ROS  Renal/GU negative Renal ROS     Musculoskeletal   Abdominal Normal abdominal exam  (+)   Peds  Hematology negative hematology ROS (+)   Anesthesia Other Findings   Reproductive/Obstetrics (+) Pregnancy                          Anesthesia Physical Anesthesia Plan  ASA: II  Anesthesia Plan: General   Post-op Pain Management:    Induction: Intravenous  Airway Management Planned: Oral ETT  Additional Equipment:   Intra-op Plan:   Post-operative Plan: Extubation in OR  Informed Consent: I have reviewed the patients History and Physical, chart, labs and discussed the procedure including the risks, benefits and alternatives for the proposed anesthesia with the patient or authorized representative who has indicated his/her understanding and acceptance.     Plan Discussed with: CRNA, Surgeon and Anesthesiologist  Anesthesia Plan Comments:        Anesthesia Quick Evaluation

## 2013-10-06 NOTE — Interval H&P Note (Signed)
History and Physical Interval Note:  10/06/2013 7:20 AM  Emily Orozco  has presented today for surgery, with the diagnosis of Sterilization  The various methods of treatment have been discussed with the patient and family. After consideration of risks, benefits and other options for treatment, the patient has consented to  Procedure(s): LAPAROSCOPIC TUBAL LIGATION (Bilateral) as a surgical intervention .  The patient's history has been reviewed, patient examined, no change in status, stable for surgery.  I have reviewed the patient's chart and labs.  Questions were answered to the patient's satisfaction.     Stuckert, Cisco

## 2013-10-06 NOTE — Discharge Instructions (Signed)

## 2013-10-06 NOTE — Addendum Note (Signed)
Addendum created 10/06/13 1228 by Billie Lade, CRNA   Modules edited: Anesthesia LDA

## 2013-10-06 NOTE — Anesthesia Postprocedure Evaluation (Signed)
  Anesthesia Post-op Note  Patient: Emily Orozco  Procedure(s) Performed: Procedure(s): Opertive Laparoscopy; Fulguration of Left Fallopian Tube (Left)  Patient Location: PACU  Anesthesia Type:General  Level of Consciousness: awake, alert  and oriented  Airway and Oxygen Therapy: Patient Spontanous Breathing  Post-op Pain: mild  Post-op Assessment: Post-op Vital signs reviewed, Patient's Cardiovascular Status Stable, Respiratory Function Stable, Patent Airway, No signs of Nausea or vomiting and Pain level controlled  Post-op Vital Signs: Reviewed and stable  Complications: No apparent anesthesia complications

## 2013-10-07 ENCOUNTER — Encounter (HOSPITAL_COMMUNITY): Payer: Self-pay | Admitting: Obstetrics and Gynecology

## 2013-10-30 ENCOUNTER — Encounter: Payer: Self-pay | Admitting: *Deleted

## 2013-11-07 ENCOUNTER — Ambulatory Visit (INDEPENDENT_AMBULATORY_CARE_PROVIDER_SITE_OTHER): Payer: 59 | Admitting: Neurology

## 2013-11-07 ENCOUNTER — Encounter (INDEPENDENT_AMBULATORY_CARE_PROVIDER_SITE_OTHER): Payer: Self-pay

## 2013-11-07 ENCOUNTER — Encounter: Payer: Self-pay | Admitting: Neurology

## 2013-11-07 VITALS — BP 126/90 | HR 79 | Resp 16 | Ht 64.0 in | Wt 167.0 lb

## 2013-11-07 DIAGNOSIS — R413 Other amnesia: Secondary | ICD-10-CM | POA: Insufficient documentation

## 2013-11-07 DIAGNOSIS — R0609 Other forms of dyspnea: Secondary | ICD-10-CM

## 2013-11-07 DIAGNOSIS — E669 Obesity, unspecified: Secondary | ICD-10-CM | POA: Insufficient documentation

## 2013-11-07 DIAGNOSIS — R0989 Other specified symptoms and signs involving the circulatory and respiratory systems: Secondary | ICD-10-CM

## 2013-11-07 DIAGNOSIS — I10 Essential (primary) hypertension: Secondary | ICD-10-CM

## 2013-11-07 DIAGNOSIS — G43901 Migraine, unspecified, not intractable, with status migrainosus: Secondary | ICD-10-CM

## 2013-11-07 DIAGNOSIS — R0683 Snoring: Secondary | ICD-10-CM

## 2013-11-07 HISTORY — DX: Snoring: R06.83

## 2013-11-07 HISTORY — DX: Essential (primary) hypertension: I10

## 2013-11-07 NOTE — Patient Instructions (Signed)
Polysomnography (Sleep Studies) Polysomnography (PSG) is a series of tests used for detecting (diagnosing) obstructive sleep apnea and other sleep disorders. The tests measure how some parts of your body are working while you are sleeping. The tests are extensive and expensive. They are done in a sleep lab or hospital, and vary from center to center. Your caregiver may perform other more simple sleep studies and questionnaires before doing more complete and involved testing. Testing may not be covered by insurance. Some of these tests are:  An EEG (Electroencephalogram). This tests your brain waves and stages of sleep.  An EOG (Electrooculogram). This measures the movements of your eyes. It detects periods of REM (rapid eye movement) sleep, which is your dream sleep.  An EKG (Electrocardiogram). This measures your heart rhythm.  EMG (Electromyography). This is a measurement of how the muscles are working in your upper airway and your legs while sleeping.  An oximetry measurement. It measures how much oxygen (air) you are getting while sleeping.  Breathing efforts may be measured. The same test can be interpreted (understood) differently by different caregivers and centers that study sleep.  Studies may be given an apnea/hypopnea index (AHI). This is a number which is found by counting the times of no breathing or under breathing during the night, and relating those numbers to the amount of time spent in bed. When the AHI is greater than 15, the patient is likely to complain of daytime sleepiness. When the AHI is greater than 30, the patient is at increased risk for heart problems and must be followed more closely. Following the AHI also allows you to know how treatment is working. Simple oximetry (tracking the amount of oxygen that is taken in) can be used for screening patients who:  Do not have symptoms (problems) of OSA.  Have a normal Epworth Sleepiness Scale Score.  Have a low pre-test  probability of having OSA.  Have none of the upper airway problems likely to cause apnea.  Oximetry is also used to determine if treatment is effective in patients who showed significant desaturations (not getting enough oxygen) on their home sleep study. One extra measure of safety is to perform additional studies for the person who only snores. This is because no one can predict with absolute certainty who will have OSA. Those who show significant desaturations (not getting enough oxygen) are recommended to have a more detailed sleep study. Document Released: 12/24/2002 Document Revised: 09/11/2011 Document Reviewed: 06/19/2005 Exit[employer] Patient Information 2014 [employer].  

## 2013-11-07 NOTE — Progress Notes (Signed)
[facility]  Provider:  Larey Seat, M D  Referring Provider: Marton Redwood, MD Primary Care Physician:  Marton Redwood, MD  Chief Complaint  Patient presents with  . New Evaluation    Room 10  . Sleep consult    HPI:  Emily Orozco is a 33 y.o. female  Is seen here as a referral  from Dr. Brigitte Pulse for a sleep consultation.  Mrs. Emily Orozco a 33 year old right-handed African American female is seen here today upon request of Dr. Marton Redwood. She has a history of hypertension and asthma .   She has IBS, only every 3rd day a regular bowl movement. She also feels fatigued and sleepy in daytime and has reportedly not felt restored or refreshed from her sleep.Dr. Brigitte Pulse  noted  that she has more frequent headaches than she used to have. Her headaches are present in the morning but stay all day.    The Emily Orozco sleep habits are as follows, she usually goes to bed around 11 PM, her husband was given the same bedroom reports that she falls asleep very promptly immediately. Her daughter and her husband have recently notched her and feel disturbed by her a lot snoring. She has woken herself up snoring and gasping for air and occasionally feels gagged and has even been vomiting.  Once or twice a month she vomits , right after arising from sleep. She sleeps on her side, cannot sleep prone and believes she cannot sleep supine for long - her snoring will wake her.  She may have 1-2 bathroom breaks at night, usually has a total sleep time of about 6 hours, she rises at 6 AM. She relies on 1 alarm setting in the morning, mostly she is awake spontaneously before the alarm rings. She takes no caffeine in the morning, he drinks smoothies  the morning . She drives her daughter to school, than drives to her Workplace. 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   She eats a light lunch , baked , not fried. She walks 2 miles a day.  she does drink iced tea for lunch ,  no Sodas and no coffee. Never smoked , rarely drank alcohol. Her whole family exercises together.  The watches TV a until 10 PM and than goes to bed around 11 PM. No TV in the bedroom, that is cool and quiet.   The patient of sleep apnea was raised because of her history of loud snoring, and also because she has needed more than one agent to control her blood pressure.  Her mother is a thunderous snorer and has heart disease, hypertension.   Her maternal grandmother had Alzheimer's dementia and was a snorer.            Review of Systems: Out of a complete 14 system review, the patient complains of only the following symptoms, and all other reviewed systems are negative. She endorses fatigue severity score at 16 points in the upper sleepiness score at 6 points.  History   Social History  . Marital Status: Married    Spouse Name: Emily Orozco    Number of Children: 1  . Years of Education: College   Occupational History  . Not on file.   Social History Main Topics  . Smoking status: Never Smoker   . Smokeless tobacco: Never Used  . Alcohol Use: Yes     Comment: rarely  . Drug Use: No  . Sexual Activity: Yes  Birth Control/ Protection: None   Other Topics Concern  . Not on file   Social History Narrative   Patient is marriedCarloyn Orozco) and lives at home with her husband and child.   Patient has one child.   Patient drinks one cup of tea daily.   Patient is right-handed.   Patient has a Associates Degree in Health/Science    Family History  Problem Relation Age of Onset  . Leukemia Father   . Migraines Brother   . Heart attack      Grandmother  . Hypertension Mother     Past Medical History  Diagnosis Date  . Hypertension   . Asthma   . Bronchitis   . Ectopic pregnancy   . Headache(784.0)     migraines occ  . 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 IBS (irritable bowel syndrome)   . Obesity, unspecified   . Encounter for tubal ligation 10/05/2013  .  Allergic rhinitis   . Unspecified essential hypertension 11/07/2013  . Snoring 11/07/2013    Past Surgical History  Procedure Laterality Date  . Cholecystectomy  2009  . Laparoscopy  07/29/2012    Procedure: LAPAROSCOPY OPERATIVE;  Surgeon: Melina Schools, MD;  Location: [facility];  Service: Gynecology;  Laterality: Right;  Evacuation Hemoperitoneum, Right Salpingectomy   . Cryoablation    . Laparoscopic tubal ligation Left 10/06/2013    Procedure: Opertive Laparoscopy; Fulguration of Left Fallopian Tube;  Surgeon: Janyth Contes, MD;  Location: [facility];  Service: Gynecology;  Laterality: Left;    Current Outpatient Prescriptions  Medication Sig Dispense Refill  . amLODipine (NORVASC) 5 MG tablet Take 5 mg by mouth daily.      . Aspirin-Acetaminophen-Caffeine (EXCEDRIN MIGRAINE PO) Take 1 tablet by mouth as needed.      . Multiple Vitamin (MULTIVITAMIN WITH MINERALS) TABS Take 1 tablet by mouth daily.       No current facility-administered medications for this visit.    Allergies as of 11/07/2013 - Review Complete 11/07/2013  Allergen Reaction Noted  . Toradol [ketorolac tromethamine] Anaphylaxis 09/26/2013  . Latex Itching 07/29/2012  . Penicillins Nausea And Vomiting 07/25/2007  . Oxycodone Itching and Rash 11/07/2013    Vitals: BP 126/90  Pulse 79  Resp 16  Ht 5\' 4"  (1.626 m)  Wt 167 lb (75.751 kg)  BMI 28.65 kg/m2  LMP 11/07/2013 Last Weight:  Wt Readings from Last 1 Encounters:  11/07/13 167 lb (75.751 kg)   Last Height:   Ht Readings from Last 1 Encounters:  11/07/13 5\' 4"  (1.626 m)    Physical exam:  General: The patient is awake, alert and appears not in acute distress. The patient is well groomed. Head: Normocephalic, atraumatic. Neck is supple. Mallampati 4, neck circumference: 15.25.  Uvula not visible.  No TMJ- she has delayed swallowing, dysphagia by report. Her mouth is dry.  Cardiovascular:  Regular rate and rhythm , without  murmurs or carotid bruit,  and without distended neck veins. Respiratory: Lungs are clear to auscultation. Skin:  Without evidence of edema, or rash Trunk: BMI is  elevated and patient  has normal posture.  Neurologic exam : The patient is awake and alert, oriented to place and time.   Memory subjective described as impaired, she has trouble with naming . There is a normal attention span & concentration ability. MOCA 27-30, all points lost in subtraction.   Speech is fluent without  dysarthria, dysphonia or aphasia.  Mood and affect  are appropriate.  Cranial nerves: Pupils are equal and briskly reactive to light.  Funduscopic exam without evidence of pallor or edema. Extraocular movements  in vertical and horizontal planes intact and without nystagmus. Visual fields by finger perimetry are intact. Hearing to finger rub intact. Facial sensation intact to fine touch. Facial motor strength is symmetric and tongue and uvula move midline.  Motor exam:  Normal tone and muscle bulk and symmetric  strength in all extremities.  Sensory:  Fine touch, pinprick and vibration were tested in all extremities. Proprioception is normal.  Coordination: Rapid alternating movements in the fingers/hands is tested and normal.  Finger-to-nose maneuver tested and normal without evidence of ataxia, dysmetria or tremor.  Gait and station: Patient walks without assistive device and is able and assisted stool climb up to the exam table.  Strength within normal limits. Stance is stable and normal. Tandem gait is intact ,  turns with 3 Steps, unfragmented.  Romberg testing is normal.  Deep tendon reflexes: in the  upper and lower extremities are symmetric and intact. Babinski maneuver response downgoing.   Assessment:  After physical and neurologic examination, review of laboratory studies, imaging, neurophysiology testing and pre-existing records, assessment is  1) Snoring and high grade Mallampati , OSA likely- SPLIT at AHI 15 and score  at 3 %.CO2 .  2) Patient has subjective "forgetfulness" , I will perform a MOCA test upon revisit.  3) Migraine may improve with sleep , She has been on Topamax, before she became " forgetful "  Plan:  Treatment plan and additional workup : SPLIT , repeat MOCA-   MOCA was 27-30 today ,

## 2014-05-04 ENCOUNTER — Encounter: Payer: Self-pay | Admitting: Neurology

## 2014-06-11 ENCOUNTER — Ambulatory Visit (INDEPENDENT_AMBULATORY_CARE_PROVIDER_SITE_OTHER): Payer: Self-pay

## 2014-06-11 ENCOUNTER — Ambulatory Visit (INDEPENDENT_AMBULATORY_CARE_PROVIDER_SITE_OTHER): Payer: Self-pay | Admitting: Family Medicine

## 2014-06-11 VITALS — BP 140/92 | HR 88 | Temp 98.3°F | Resp 16 | Ht 63.0 in | Wt 167.0 lb

## 2014-06-11 DIAGNOSIS — M940 Chondrocostal junction syndrome [Tietze]: Secondary | ICD-10-CM

## 2014-06-11 DIAGNOSIS — I10 Essential (primary) hypertension: Secondary | ICD-10-CM

## 2014-06-11 DIAGNOSIS — G43009 Migraine without aura, not intractable, without status migrainosus: Secondary | ICD-10-CM

## 2014-06-11 DIAGNOSIS — R079 Chest pain, unspecified: Secondary | ICD-10-CM

## 2014-06-11 NOTE — Patient Instructions (Signed)
Costochondritis Costochondritis, sometimes called Tietze syndrome, is a swelling and irritation (inflammation) of the tissue (cartilage) that connects your ribs with your breastbone (sternum). It causes pain in the chest and rib area. Costochondritis usually goes away on its own over time. It can take up to 6 weeks or longer to get better, especially if you are unable to limit your activities. CAUSES  Some cases of costochondritis have no known cause. Possible causes include:  Injury (trauma).  Exercise or activity such as lifting.  Severe coughing. SIGNS AND SYMPTOMS  Pain and tenderness in the chest and rib area.  Pain that gets worse when coughing or taking deep breaths.  Pain that gets worse with specific movements. DIAGNOSIS  Your health care provider will do a physical exam and ask about your symptoms. Chest X-rays or other tests may be done to rule out other problems. TREATMENT  Costochondritis usually goes away on its own over time. Your health care provider may prescribe medicine to help relieve pain. HOME CARE INSTRUCTIONS   Avoid exhausting physical activity. Try not to strain your ribs during normal activity. This would include any activities using chest, abdominal, and side muscles, especially if heavy weights are used.  Apply ice to the affected area for the first 2 days after the pain begins.  Put ice in a plastic bag.  Place a towel between your skin and the bag.  Leave the ice on for 20 minutes, 2-3 times a day.  Only take over-the-counter or prescription medicines as directed by your health care provider. SEEK MEDICAL CARE IF:  You have redness or swelling at the rib joints. These are signs of infection.  Your pain does not go away despite rest or medicine. SEEK IMMEDIATE MEDICAL CARE IF:   Your pain increases or you are very uncomfortable.  You have shortness of breath or difficulty breathing.  You cough up blood.  You have worse chest pains,  sweating, or vomiting.  You have a fever or persistent symptoms for more than 2-3 days.  You have a fever and your symptoms suddenly get worse. MAKE SURE YOU:   Understand these instructions.  Will watch your condition.  Will get help right away if you are not doing well or get worse. Document Released: 03/29/2005 Document Revised: 04/09/2013 Document Reviewed: 01/21/2013 Exit[employer] Patient Information 2015 [employer]. This information is not intended to replace advice given to you by your health care provider. Make sure you discuss any questions you have with your health care provider.  

## 2014-06-11 NOTE — Progress Notes (Addendum)
Subjective:    Patient ID: Emily Orozco, female    DOB: May 23, 1981, 33 y.o.   MRN: [id number]  This chart was scribed for Wardell Honour, MD by Tula Nakayama, ED Scribe. This patient was seen at [facility] and the patient's care was started at 8:42 PM.   06/11/2014  Chest Pain   HPI  HPI Comments: Emily Orozco is a 33 y.o. female with a history of migraines, cholecystectomy and tubal ligation who presents to [facility] complaining of intermittent episodes of 7/10, left-sided, non-radiating chest pain that started a few days ago. She characterizes pain as sharp and needle-like lasting 30 min-1 hour and accompanied by left arm weakness. She has had 2 episodes today. Pt states weakness, diaphoresis, headache, rhinorrhea, congestion and nausea as associated symptoms. She notes chest pain becomes worse with movement. Pt reports that her BP measured 150/90 this afternoon. She takes medication for HTN, but notes that she is sometimes noncompliant with daily use of medication. Pt called Dr. Brigitte Pulse for symptoms who counseled her to be evalulated for MI.   Pt notes that her headache is typical of her migraines which she characterizes as dizziness, blurred vision and pain over her left eye, photophobia. She states she gets migraines every 3-4 months and treats symptoms with Excedrin.   Pt has a recent cough associated with chronic bronchitis. She has tried Mucinex with relief to symptoms and reports that she has not been coughing much the last two days. Pt denies a history of similar symptoms.   Pt has a family history of HTN. Her father died at 33 from leukemia, but she does not know his other medical history. Pt denies family history of CAD less than 73 y.o. She denies recent travel and smoking. Pt also denies fever, SOB, sore throat, ear pain, numbness, tingling, slurred speech and leg swelling.  Review of Systems  Constitutional: Positive for diaphoresis. Negative for fever, chills, activity change,  appetite change and unexpected weight change.  HENT: Positive for congestion and rhinorrhea. Negative for ear pain and sore throat.   Eyes: Positive for visual disturbance.  Respiratory: Positive for cough. Negative for shortness of breath and wheezing.   Cardiovascular: Positive for chest pain. Negative for palpitations and leg swelling.  Gastrointestinal: Positive for nausea. Negative for vomiting, abdominal pain and diarrhea.  Skin: Negative for rash.  Neurological: Positive for dizziness, weakness and headaches. Negative for tremors, seizures, syncope, facial asymmetry, speech difficulty and numbness.    Past Medical History  Diagnosis Date  . Hypertension   . Asthma   . Bronchitis   . Ectopic pregnancy   . Headache(784.0)     migraines occ  . 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 IBS (irritable bowel syndrome)   . Obesity, unspecified   . Encounter for tubal ligation 10/05/2013  . Allergic rhinitis   . Unspecified essential hypertension 11/07/2013  . Snoring 11/07/2013   Past Surgical History  Procedure Laterality Date  . Cholecystectomy  2009  . Laparoscopy  07/29/2012    Procedure: LAPAROSCOPY OPERATIVE;  Surgeon: Melina Schools, MD;  Location: [facility];  Service: Gynecology;  Laterality: Right;  Evacuation Hemoperitoneum, Right Salpingectomy   . Cryoablation    . Laparoscopic tubal ligation Left 10/06/2013    Procedure: Opertive Laparoscopy; Fulguration of Left Fallopian Tube;  Surgeon: Janyth Contes, MD;  Location: [facility];  Service: Gynecology;  Laterality: Left;  . Tubal ligation     Allergies  Allergen  Reactions  . Toradol [Ketorolac Tromethamine] Anaphylaxis    SOB increased heart rate eyes rolled back into head  . Latex Itching  . Penicillins Nausea And Vomiting  . Oxycodone Itching and Rash   Current Outpatient Prescriptions  Medication Sig Dispense Refill  . amLODipine (NORVASC) 5 MG tablet Take 5 mg by mouth daily.     No current  facility-administered medications for this visit.       Objective:    BP 140/92 mmHg  Pulse 88  Temp(Src) 98.3 F (36.8 C) (Oral)  Resp 16  Ht 5\' 3"  (1.6 m)  Wt 167 lb (75.751 kg)  BMI 29.59 kg/m2  SpO2 97%  LMP 06/04/2014 Physical Exam  Constitutional: She is oriented to person, place, and time. She appears well-developed and well-nourished. No distress.  HENT:  Head: Normocephalic and atraumatic.  Right Ear: External ear normal.  Left Ear: External ear normal.  Nose: Nose normal.  Mouth/Throat: Oropharynx is clear and moist.  Eyes: Conjunctivae and EOM are normal. Pupils are equal, round, and reactive to light.  Neck: Normal range of motion. Neck supple. Carotid bruit is not present. No tracheal deviation present. No thyromegaly present.  Cardiovascular: Normal rate, regular rhythm, normal heart sounds and intact distal pulses.  Exam reveals no gallop and no friction rub.   No murmur heard. Pulmonary/Chest: Effort normal and breath sounds normal. No respiratory distress. She has no wheezes. She has no rales. She exhibits tenderness.  Tender to palpation to left, upper chest wall diffusely that reproduced her CP.  Abdominal: Soft. Bowel sounds are normal. She exhibits no distension and no mass. There is no tenderness. There is no rebound and no guarding.  Musculoskeletal:       Right shoulder: Normal. She exhibits normal range of motion.       Left shoulder: Normal. She exhibits normal range of motion.       Cervical back: She exhibits pain. She exhibits normal range of motion, no tenderness and no spasm.  Pain in L anterior chest reproduced with extension of L neck.  Lymphadenopathy:    She has no cervical adenopathy.  Neurological: She is alert and oriented to person, place, and time. No cranial nerve deficit.  Skin: Skin is warm and dry. No rash noted. She is not diaphoretic. No erythema. No pallor.  Psychiatric: She has a normal mood and affect. Her behavior is normal.    Nursing note and vitals reviewed.  Results for orders placed or performed during the hospital encounter of 10/06/13  Pregnancy, urine  Result Value Ref Range   Preg Test, Ur NEGATIVE NEGATIVE   EKG: NSR; no acute ST changes.  [facility] reading (PRIMARY) by  Dr. Tamala Julian.  CXR: NAD      Assessment & Plan:   1. Chest pain, unspecified chest pain type   2. Migraine without aura and without status migrainosus, not intractable   3. Essential hypertension, benign   4. Costochondritis     1. Chest pain:  New.  Worsens with changes in position and palpation of L anterior chest reproduces pain.  Consistent with costochondritis. 2.   Costochondritis:  New.  Recommend Aleve or Ibuprofen scheduled for two weeks; avoid heavy lifting for two weeks.  3.  HTN: uncontrolled due to non-compliance with Amlodipine; encourage daily use of Amlodipine to prevent complications of HTN including CAD, CVA. 4.  Migraine headaches:  Recurrent; continue Excedrin Migraine or Ibuprofen 800mg  tid PRN. Normal neurological exam; headache consistent with patient's typical migraine.  Recently started a new job which is likely contributing to migraine.    No orders of the defined types were placed in this encounter.    No Follow-up on file.   I personally performed the services described in this documentation, which was scribed in my presence. The recorded information has been reviewed and considered.  Reginia Forts, M.D.  [facility] [address] [place], [place]  [phone] phone [phone] fax

## 2014-06-15 ENCOUNTER — Encounter: Payer: Self-pay | Admitting: Family Medicine

## 2014-09-30 ENCOUNTER — Other Ambulatory Visit: Payer: Self-pay | Admitting: Gynecology

## 2014-09-30 DIAGNOSIS — R922 Inconclusive mammogram: Secondary | ICD-10-CM

## 2014-09-30 DIAGNOSIS — N644 Mastodynia: Secondary | ICD-10-CM

## 2014-10-16 ENCOUNTER — Other Ambulatory Visit: Payer: Self-pay | Admitting: Internal Medicine

## 2014-10-16 DIAGNOSIS — R0989 Other specified symptoms and signs involving the circulatory and respiratory systems: Secondary | ICD-10-CM

## 2014-10-19 ENCOUNTER — Ambulatory Visit
Admission: RE | Admit: 2014-10-19 | Discharge: 2014-10-19 | Disposition: A | Discharge status: Home / Self Care | Payer: 59 | Source: Ambulatory Visit | Attending: Internal Medicine | Admitting: Internal Medicine

## 2014-10-19 DIAGNOSIS — R0989 Other specified symptoms and signs involving the circulatory and respiratory systems: Secondary | ICD-10-CM

## 2014-11-06 ENCOUNTER — Encounter: Payer: Self-pay | Admitting: Physician Assistant

## 2014-11-23 ENCOUNTER — Emergency Department (HOSPITAL_COMMUNITY)
Admission: EM | Admit: 2014-11-23 | Discharge: 2014-11-23 | Disposition: A | Discharge status: Home / Self Care | Payer: 59 | Attending: Emergency Medicine | Admitting: Emergency Medicine

## 2014-11-23 ENCOUNTER — Emergency Department (HOSPITAL_COMMUNITY): Payer: 59

## 2014-11-23 ENCOUNTER — Encounter (HOSPITAL_COMMUNITY): Payer: Self-pay | Admitting: Radiology

## 2014-11-23 DIAGNOSIS — F458 Other somatoform disorders: Secondary | ICD-10-CM | POA: Diagnosis not present

## 2014-11-23 DIAGNOSIS — Z880 Allergy status to penicillin: Secondary | ICD-10-CM | POA: Diagnosis not present

## 2014-11-23 DIAGNOSIS — R0602 Shortness of breath: Secondary | ICD-10-CM | POA: Diagnosis present

## 2014-11-23 DIAGNOSIS — Z8541 Personal history of malignant neoplasm of cervix uteri: Secondary | ICD-10-CM | POA: Insufficient documentation

## 2014-11-23 DIAGNOSIS — J45901 Unspecified asthma with (acute) exacerbation: Secondary | ICD-10-CM | POA: Insufficient documentation

## 2014-11-23 DIAGNOSIS — Z91040 Latex allergy status: Secondary | ICD-10-CM | POA: Insufficient documentation

## 2014-11-23 DIAGNOSIS — G43909 Migraine, unspecified, not intractable, without status migrainosus: Secondary | ICD-10-CM | POA: Insufficient documentation

## 2014-11-23 DIAGNOSIS — Z79899 Other long term (current) drug therapy: Secondary | ICD-10-CM | POA: Diagnosis not present

## 2014-11-23 DIAGNOSIS — I10 Essential (primary) hypertension: Secondary | ICD-10-CM | POA: Diagnosis not present

## 2014-11-23 DIAGNOSIS — R0989 Other specified symptoms and signs involving the circulatory and respiratory systems: Secondary | ICD-10-CM

## 2014-11-23 DIAGNOSIS — E669 Obesity, unspecified: Secondary | ICD-10-CM | POA: Insufficient documentation

## 2014-11-23 DIAGNOSIS — Z8719 Personal history of other diseases of the digestive system: Secondary | ICD-10-CM | POA: Insufficient documentation

## 2014-11-23 MED ORDER — IOHEXOL 300 MG/ML  SOLN
100.0000 mL | Freq: Once | INTRAMUSCULAR | Status: AC | PRN
  Administered 2014-11-23: 100 mL via INTRAVENOUS

## 2014-11-23 MED ORDER — PREDNISONE 20 MG PO TABS: 60.0000 mg | ORAL_TABLET | Freq: Every day | ORAL | Status: DC

## 2014-11-23 NOTE — ED Provider Notes (Signed)
CSN: [id number]     Arrival date & time 11/23/14  0907 History   First MD Initiated Contact with Patient 11/23/14 [phone]     Chief Complaint  Patient presents with  . Shortness of Breath     (Consider location/radiation/quality/duration/timing/severity/associated sxs/prior Treatment) Patient is a 34 y.o. female presenting with shortness of breath.  Shortness of Breath Associated symptoms: cough   Associated symptoms: no abdominal pain, no chest pain, no headaches, no rash, no vomiting and no wheezing    patient has had some difficulty breathing and choking type episodes the last 2 months. She seen her primary care doctor for it. He had a recent thyroid ultrasound that showed a small cyst and a small nodule. Reportedly had negative thyroid labs. She has seen Dr. Brigitte Pulse for this. She states that she feels food get caught in her throat. She states she feels as if there is some "cold" in her throat that has come up. She states at night she has to sleep upright because when she lays down she feels as if she can't breathe. She states she will also end up vomiting with this. States that she will feel food get caught in her throat when she eats. She states she will need extra water to help down. She is scheduled for a swallowing study by gastroenterology in 2 days. No severe weight loss or weight gain. No fevers. She's had some nasal congestion.  Past Medical History  Diagnosis Date  . Hypertension   . Asthma   . Bronchitis   . Ectopic pregnancy   . Headache(784.0)     migraines occ  . 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 IBS (irritable bowel syndrome)   . Obesity, unspecified   . Encounter for tubal ligation 10/05/2013  . Allergic rhinitis   . Unspecified essential hypertension 11/07/2013  . Snoring 11/07/2013   Past Surgical History  Procedure Laterality Date  . Cholecystectomy  2009  . Laparoscopy  07/29/2012    Procedure: LAPAROSCOPY OPERATIVE;  Surgeon: Melina Schools, MD;   Location: [facility];  Service: Gynecology;  Laterality: Right;  Evacuation Hemoperitoneum, Right Salpingectomy   . Cryoablation    . Laparoscopic tubal ligation Left 10/06/2013    Procedure: Opertive Laparoscopy; Fulguration of Left Fallopian Tube;  Surgeon: Janyth Contes, MD;  Location: [facility];  Service: Gynecology;  Laterality: Left;  . Tubal ligation     Family History  Problem Relation Age of Onset  . Leukemia Father   . Cancer Father   . Migraines Brother   . Heart attack      Grandmother  . Hypertension Mother   . Hypertension Maternal Grandmother   . Stroke Maternal Grandfather    History  Substance Use Topics  . Smoking status: Never Smoker   . Smokeless tobacco: Never Used  . Alcohol Use: Yes     Comment: rarely   OB History    Gravida Para Term Preterm AB TAB SAB Ectopic Multiple Living   2 1        1      Review of Systems  Constitutional: Negative for activity change and appetite change.  Eyes: Negative for pain.  Respiratory: Positive for cough, choking and shortness of breath. Negative for chest tightness and wheezing.   Cardiovascular: Negative for chest pain and leg swelling.  Gastrointestinal: Negative for nausea, vomiting, abdominal pain and diarrhea.  Genitourinary: Negative for flank pain.  Musculoskeletal: Negative for back pain and neck stiffness.  Skin: Negative for rash.  Neurological: Negative for weakness, numbness and headaches.  Psychiatric/Behavioral: Negative for behavioral problems.      Allergies  Toradol; Latex; Penicillins; and Oxycodone  Home Medications   Prior to Admission medications   Medication Sig Start Date End Date Taking? Authorizing Provider  acetaminophen (TYLENOL) 500 MG tablet Take 500 mg by mouth every 6 (six) hours as needed for mild pain.   Yes Historical Provider, MD  amLODipine (NORVASC) 10 MG tablet Take 10 mg by mouth daily.   Yes Historical Provider, MD  Multiple Vitamins-Minerals (MULTI ADULT GUMMIES) CHEW  Chew 1 tablet by mouth daily.   Yes Historical Provider, MD  pseudoephedrine (SUDAFED) 30 MG tablet Take 30 mg by mouth every 4 (four) hours as needed for congestion.   Yes Historical Provider, MD  predniSONE (DELTASONE) 20 MG tablet Take 3 tablets (60 mg total) by mouth daily. 11/23/14   Davonna Belling, MD   BP 142/84 mmHg  Pulse 78  Temp(Src) 98.5 F (36.9 C) (Oral)  Resp 18  SpO2 100%  LMP 11/08/2014 Physical Exam  Constitutional: She is oriented to person, place, and time. She appears well-developed and well-nourished.  HENT:  Head: Normocephalic and atraumatic.  Neck: Normal range of motion. Neck supple.  Small amount fullness to thyroid.  Cardiovascular: Normal rate, regular rhythm and normal heart sounds.   No murmur heard. Pulmonary/Chest: Effort normal. No respiratory distress.  Slightly harsh breath sounds  Abdominal: Soft. Bowel sounds are normal. She exhibits no distension. There is no tenderness. There is no rebound and no guarding.  Neurological: She is alert and oriented to person, place, and time. No cranial nerve deficit.  Skin: Skin is warm and dry.  Psychiatric: Her speech is normal.  Nursing note and vitals reviewed.   ED Course  Procedures (including critical care time) Labs Review Labs Reviewed - No data to display  Imaging Review Dg Chest 2 View  11/23/2014   CLINICAL DATA:  Shortness of Breath  EXAM: CHEST  2 VIEW  COMPARISON:  June 11, 2014  FINDINGS: Lungs are clear. Heart size and pulmonary vascularity are normal. No adenopathy. No bone lesions.  IMPRESSION: No edema or consolidation.   Electronically Signed   By: Lowella Grip III M.D.   On: 11/23/2014 11:17   Ct Soft Tissue Neck W Contrast  11/23/2014   CLINICAL DATA:  Dysphagia, shortness of breath.  EXAM: CT NECK WITH CONTRAST  TECHNIQUE: Multidetector CT imaging of the neck was performed using the standard protocol following the bolus administration of intravenous contrast.  CONTRAST:   161mL OMNIPAQUE IOHEXOL 300 MG/ML  SOLN  COMPARISON:  None.  FINDINGS: Pharynx and larynx: No definite abnormality seen.  Salivary glands: Parotid and salivary glands appear normal.  Thyroid: 7 mm nodule is noted in right thyroid lobe.  Lymph nodes: No significantly enlarged adenopathy is noted.  Vascular: Visualized vasculature appears normal.  Limited intracranial: No abnormality visualized.  Visualized orbits: Normal.  Mastoids and visualized paranasal sinuses: Normal.  Skeleton: No definite abnormality is noted.  Upper chest: Visualized portions appear normal.  IMPRESSION: 7 mm nodule seen in right thyroid lobe. No other abnormality seen in the soft tissues of the neck.   Electronically Signed   By: Marijo Conception, M.D.   On: 11/23/2014 11:21     EKG Interpretation None      MDM   Final diagnoses:  Globus sensation    Patient with feeling of foreign body in her throat. Has  had ultrasound. Has been seen by ENT. CT scan done and does not show clear cause. Discussed with Dr. Brigitte Pulse, will see the patient follow-up. She has a swallow study in 2 days.    Davonna Belling, MD 11/24/14 [phone]

## 2014-11-23 NOTE — ED Notes (Signed)
Patient transported to CT 

## 2014-11-23 NOTE — Discharge Instructions (Signed)
Follow-up for the swallow study in 2 days as planned Somatization Disorder Somatization disorder is a type of somatoform disorder. It occurs when a person feels pain and other symptoms that cannot be explained by medical findings. It is diagnosed after many medical tests and exams. The pain, physical symptoms, or emotional symptoms are real. They are not faked or created. Often, people with somatization disorder are worried about their symptoms. Along with a medical caregiver, a mental health caregiver can help to diagnose somatization disorder and teach coping skills. The disorder can last for several years and may be intermittent. CAUSES  It is unclear what causes somatization disorder. It may be related to:  Abnormal nerve impulses.  Psychological stress.  Family history.  Chronic pain.  History of sexual or physical abuse. The disorder usually begins before age 28. It occurs more often in women than men. SYMPTOMS There are many symptoms that can accompany somatization disorder. Usually, several mild to moderate symptoms appear. A person with somatization disorder seeks many medical visits to try to determine the cause of symptoms. DIAGNOSIS Diagnostic testing will vary depending on the specific symptoms involved. A person with this disorder will often have many medical tests and exams to rule out serious physical health problems. Evaluation may include:  Physical exam.  Screening health questionnaire.  Medical tests, such as blood tests, urine tests, X-rays, or other imaging tests. TREATMENT There is no cure for somatization disorder, but the symptoms can be managed and sometimes prevented. Treatment aims at teaching coping skills, reducing stress, and preventing new episodes. Once somatization disorder is diagnosed, treatment may include:  Regular monitoring and consultation with a dedicated caregiver for evaluation and coping skills.  Regular monitoring and therapy with a mental  health provider to increase understanding of triggers and coping skills. Therapies may include:  Talk therapy.  Cognitive behavioral therapy.  Antidepressant medicines. It can be helpful for a primary caregiver and mental health provider to work together to develop a treatment plan. HOME CARE INSTRUCTIONS  Follow up with your primary caregiver or mental health provider as directed.  Take medicines as directed.  Try to reduce stress.  Exercise regularly. SEEK MEDICAL CARE IF:  New symptoms appear.  Pain or symptoms do not go away or become severe. SEEK IMMEDIATE MEDICAL CARE IF: You feel that your life or health are in danger. MAKE SURE YOU:  Understand these instructions.  Will watch your condition.  Will get help right away if you are not doing well or get worse. Document Released: 07/22/2010 Document Revised: 09/11/2011 Document Reviewed: 07/22/2010 [facility] Patient Information 2015 [place], [place]. This information is not intended to replace advice given to you by your health care provider. Make sure you discuss any questions you have with your health care provider.

## 2014-11-23 NOTE — ED Notes (Addendum)
Pt c/o SOB, states that her throat "is closing," states that she has had this issue ongoing and has been seen and had an ultrasound performed on throat 2 weeks ago which showed a nodule on her right side of throat and cyst on left side of throat. Denies CP. Pt not in acute distress at this time, respirations even and unlabored, pt ambulated length of ED without distress, voice quality is regular, pt in control of oral secretions. Primary nurse made aware of airway complaint.

## 2014-11-25 ENCOUNTER — Ambulatory Visit (INDEPENDENT_AMBULATORY_CARE_PROVIDER_SITE_OTHER): Payer: 59 | Admitting: Physician Assistant

## 2014-11-25 ENCOUNTER — Encounter: Payer: Self-pay | Admitting: Physician Assistant

## 2014-11-25 ENCOUNTER — Other Ambulatory Visit (HOSPITAL_COMMUNITY): Payer: Self-pay | Admitting: Physician Assistant

## 2014-11-25 ENCOUNTER — Other Ambulatory Visit (INDEPENDENT_AMBULATORY_CARE_PROVIDER_SITE_OTHER): Payer: 59

## 2014-11-25 VITALS — BP 122/74 | HR 70 | Ht 62.0 in | Wt 175.0 lb

## 2014-11-25 DIAGNOSIS — R1314 Dysphagia, pharyngoesophageal phase: Secondary | ICD-10-CM

## 2014-11-25 DIAGNOSIS — R1310 Dysphagia, unspecified: Secondary | ICD-10-CM | POA: Diagnosis not present

## 2014-11-25 DIAGNOSIS — K219 Gastro-esophageal reflux disease without esophagitis: Secondary | ICD-10-CM

## 2014-11-25 DIAGNOSIS — R197 Diarrhea, unspecified: Secondary | ICD-10-CM

## 2014-11-25 LAB — CBC WITH DIFFERENTIAL/PLATELET
Basophils Absolute: 0.1 10*3/uL (ref 0.0–0.1)
Basophils Relative: 0.7 % (ref 0.0–3.0)
EOS PCT: 0 % (ref 0.0–5.0)
Eosinophils Absolute: 0 10*3/uL (ref 0.0–0.7)
HCT: 35.2 % — ABNORMAL LOW (ref 36.0–46.0)
HEMOGLOBIN: 12 g/dL (ref 12.0–15.0)
Lymphocytes Relative: 33.3 % (ref 12.0–46.0)
Lymphs Abs: 4.7 10*3/uL — ABNORMAL HIGH (ref 0.7–4.0)
MCHC: 34 g/dL (ref 30.0–36.0)
MCV: 89.2 fl (ref 78.0–100.0)
MONO ABS: 0.9 10*3/uL (ref 0.1–1.0)
Monocytes Relative: 6.6 % (ref 3.0–12.0)
NEUTROS ABS: 8.3 10*3/uL — AB (ref 1.4–7.7)
Neutrophils Relative %: 59.4 % (ref 43.0–77.0)
Platelets: 541 10*3/uL — ABNORMAL HIGH (ref 150.0–400.0)
RBC: 3.95 Mil/uL (ref 3.87–5.11)
RDW: 13.9 % (ref 11.5–15.5)
WBC: 14 10*3/uL — ABNORMAL HIGH (ref 4.0–10.5)

## 2014-11-25 LAB — IGA: IgA: 194 mg/dL (ref 68–378)

## 2014-11-25 MED ORDER — PANTOPRAZOLE SODIUM 40 MG PO TBEC: 40.0000 mg | DELAYED_RELEASE_TABLET | Freq: Every day | ORAL | Status: DC

## 2014-11-25 MED ORDER — CHOLESTYRAMINE 4 G PO PACK
4.0000 g | PACK | Freq: Every day | ORAL | Status: DC
Start: 2014-11-25 — End: 2016-05-13

## 2014-11-25 MED ORDER — CHOLESTYRAMINE 4 G PO PACK: 4.0000 g | PACK | Freq: Three times a day (TID) | ORAL | Status: DC

## 2014-11-25 MED ORDER — PANTOPRAZOLE SODIUM 40 MG PO TBEC
40.0000 mg | DELAYED_RELEASE_TABLET | Freq: Every day | ORAL | Status: DC
Start: 2014-11-25 — End: 2016-05-12

## 2014-11-25 NOTE — Progress Notes (Signed)
Agree with initial assessment and plans as outlined 

## 2014-11-25 NOTE — Progress Notes (Addendum)
Patient ID: Emily Orozco, female   DOB: 1981-01-25, 34 y.o.   MRN: [id number]    HPI:  Emily Orozco is a 34 y.o.   female referred by Marton Redwood, MD for evaluation of dysphagia and diarrhea.  [name] [facility] 2015 with intermittent episodes of chest pain that were found to be noncardiac. She was then seen in the emergency room on May 23 with episodes of choking. She reports that for the past several months she feels food gets stuck in her throat. She feels as if she constantly has a lump in her throat. She reports that at night she frequently gets mouthfuls of regurgitation coming up and wakes up choking. She says that for years she had acid reflux and then had a cholecystectomy. She says she has no "heartburn" since her gallbladder surgery but she continues to get frequent regurgitation. She has had difficulty swallowing solids for 3 months but on further questioning says it is actually been going on for over a year. For the first few months she was able to push solids down with ingestion of liquids, but more recently she has dysphagia to liquids as well. She also feels that over the past 2 months food and liquids "want to go down the wrong pipe". She will often cough and sputter with meals. She frequently has to stop eating because she feels food is trapped in the back of her throat. She has epigastric pain worse after meals that feels similar to her gallbladder pain. She has no radiation of the pain. She has not vomited. She reports that she saw Dr. Constance Holster of [facility] 3 weeks ago and was told to avoid acidic foods but was not started on any medications. She had a CT of the soft tissue of the neck recently that revealed a 7 mm nodule seen in the right thyroid lobe.  She also reports that up until her gallbladder surgery she had a normal, formed, bowel movement on a daily basis. Since several days after her gallbladder surgery she has 3-4  mushy green-colored stools on a daily basis. She states when she eats she will get crampy and about 30-90 minutes after each meal have a mushy bowel movement. She has had no bright red blood per rectum or melanotic. She was seen by her primary care provider and given a trial of fiber Z which has not provided any relief at all.   Past Medical History  Diagnosis Date  . Hypertension   . Asthma   . Bronchitis   . Ectopic pregnancy   . Headache(784.0)     migraines occ  . 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 IBS (irritable bowel syndrome)   . Obesity, unspecified   . Encounter for tubal ligation 10/05/2013  . Allergic rhinitis   . Unspecified essential hypertension 11/07/2013  . Snoring 11/07/2013    Past Surgical History  Procedure Laterality Date  . Cholecystectomy  2009  . Laparoscopy  07/29/2012    Procedure: LAPAROSCOPY OPERATIVE;  Surgeon: Melina Schools, MD;  Location: [facility];  Service: Gynecology;  Laterality: Right;  Evacuation Hemoperitoneum, Right Salpingectomy   . Cryoablation    . Laparoscopic tubal ligation Left 10/06/2013    Procedure: Opertive Laparoscopy; Fulguration of Left Fallopian Tube;  Surgeon: Janyth Contes, MD;  Location: [facility];  Service: Gynecology;  Laterality: Left;  . Tubal ligation     Family  History  Problem Relation Age of Onset  . Leukemia Father   . Cancer Father   . Migraines Brother   . Heart attack      Grandmother  . Hypertension Mother   . Hypertension Maternal Grandmother   . Stroke Maternal Grandfather    History  Substance Use Topics  . Smoking status: Never Smoker   . Smokeless tobacco: Never Used  . Alcohol Use: No     Comment: rarely   Current Outpatient Prescriptions  Medication Sig Dispense Refill  . acetaminophen (TYLENOL) 500 MG tablet Take 500 mg by mouth every 6 (six) hours as needed for mild pain.    Marland Kitchen amLODipine (NORVASC) 10 MG tablet Take 10 mg by mouth daily.    . cholestyramine (QUESTRAN) 4 G packet  Take 1 packet (4 g total) by mouth daily. 1 packet daily for 1 week if no improvement increase to twice daily. 60 each 11  . Multiple Vitamins-Minerals (MULTI ADULT GUMMIES) CHEW Chew 1 tablet by mouth daily.    . pantoprazole (PROTONIX) 40 MG tablet Take 1 tablet (40 mg total) by mouth daily. 90 tablet 3  . predniSONE (DELTASONE) 20 MG tablet Take 3 tablets (60 mg total) by mouth daily. 12 tablet 0  . pseudoephedrine (SUDAFED) 30 MG tablet Take 30 mg by mouth every 4 (four) hours as needed for congestion.     No current facility-administered medications for this visit.   Allergies  Allergen Reactions  . Toradol [Ketorolac Tromethamine] Anaphylaxis    SOB increased heart rate eyes rolled back into head  . Latex Itching  . Penicillins Nausea And Vomiting  . Oxycodone Itching and Rash     Review of Systems: Gen: Denies any fever, chills, sweats, anorexia, fatigue, weakness, malaise, weight loss, and sleep disorder CV: Denies chest pain, angina, palpitations, syncope, orthopnea, PND, peripheral edema, and claudication. Resp: Denies dyspnea at rest, dyspnea with exercise, cough, sputum, wheezing, coughing up blood, and pleurisy. GI: Denies vomiting blood, jaundice, and fecal incontinence.  Has dysphagia to solids and liquids GU : Denies urinary burning, blood in urine, urinary frequency, urinary hesitancy, nocturnal urination, and urinary incontinence. MS: Denies joint pain, limitation of movement, and swelling, stiffness, low back pain, extremity pain. Denies muscle weakness, cramps, atrophy.  Derm: Denies rash, itching, dry skin, hives, moles, warts, or unhealing ulcers.  Psych: Denies depression, anxiety, memory loss, suicidal ideation, hallucinations, paranoia, and confusion. Heme: Denies bruising, bleeding, and enlarged lymph nodes. Neuro:  Denies any headaches, dizziness, paresthesias. Endo:  Denies any problems with DM adrenal function  Studies: Dg Chest 2 View  11/23/2014    CLINICAL DATA:  Shortness of Breath  EXAM: CHEST  2 VIEW  COMPARISON:  June 11, 2014  FINDINGS: Lungs are clear. Heart size and pulmonary vascularity are normal. No adenopathy. No bone lesions.  IMPRESSION: No edema or consolidation.   Electronically Signed   By: Lowella Grip III M.D.   On: 11/23/2014 11:17   Ct Soft Tissue Neck W Contrast  11/23/2014   CLINICAL DATA:  Dysphagia, shortness of breath.  EXAM: CT NECK WITH CONTRAST  TECHNIQUE: Multidetector CT imaging of the neck was performed using the standard protocol following the bolus administration of intravenous contrast.  CONTRAST:  144mL OMNIPAQUE IOHEXOL 300 MG/ML  SOLN  COMPARISON:  None.  FINDINGS: Pharynx and larynx: No definite abnormality seen.  Salivary glands: Parotid and salivary glands appear normal.  Thyroid: 7 mm nodule is noted in right thyroid lobe.  Lymph nodes:  No significantly enlarged adenopathy is noted.  Vascular: Visualized vasculature appears normal.  Limited intracranial: No abnormality visualized.  Visualized orbits: Normal.  Mastoids and visualized paranasal sinuses: Normal.  Skeleton: No definite abnormality is noted.  Upper chest: Visualized portions appear normal.  IMPRESSION: 7 mm nodule seen in right thyroid lobe. No other abnormality seen in the soft tissues of the neck.   Electronically Signed   By: Marijo Conception, M.D.   On: 11/23/2014 11:21    Physical Exam: BP 122/74 mmHg  Pulse 70  Ht 5\' 2"  (1.575 m)  Wt 175 lb (79.379 kg)  BMI 32.00 kg/m2  SpO2 99%  LMP 11/08/2014 Constitutional: Pleasant,well-developed female in no acute distress. HEENT: Normocephalic and atraumatic. Conjunctivae are normal. No scleral icterus. Neck supple. No JVD. Thyroid nodule on right Cardiovascular: Normal rate, regular rhythm.  Pulmonary/chest: Effort normal and breath sounds normal. No wheezing, rales or rhonchi. Abdominal: Soft, nondistended, nontender. Bowel sounds active throughout. There are no masses palpable. No  hepatomegaly. Extremities: no edema Lymphadenopathy: No cervical adenopathy noted. Neurological: Alert and oriented to person place and time. Skin: Skin is warm and dry. No rashes noted. Psychiatric: Normal mood and affect. Behavior is normal.  ASSESSMENT AND PLAN: #1. GERD. An antireflux regimen has been reviewed. She will be given a trial of pantoprazole 40 mg 1 by mouth every morning 30 minutes prior to breakfast.  #2. Dysphagia. This may be due to #1. She will be scheduled for an esophagram with barium tablet as well as a modified barium swallow with speech pathology. After completion of these tests. She will be scheduled for an EGD with possible dilation.The risks, benefits, and alternatives to endoscopy with possible biopsy and possible dilation were discussed with the patient and they consent to proceed. Patient has signed a medical release to obtain records from Dr. Constance Holster of [facility].  #3. Diarrhea. Patient describes postprandial mushy stools that started several days after her cholecystectomy. She may have colorrhea diarrhea. She will be given a trial of Questran 4 g one packet once daily. If this provides no relief after 1 week, she has been advised to increase to one packet twice daily. She will discontinue viberzi, although she says she has not been using it for the past several days because it provided no relief. A CBC, IgA, and TTG will be obtained.  Further recommendations will be made pending the findings of the above.    Kaena Santori, Deloris Ping 11/25/2014, 11:48 AM  CC: Marton Redwood, MD  12/01/14: Received notes from Dr. Constance Holster. He felt her symptoms were due to chronic reflux/LPR  from excessive caffeine consumption. He recommended she quit coffee, sweet tea and [facility] and follow up if her symptoms did not improve.

## 2014-11-25 NOTE — Patient Instructions (Signed)
Your physician has requested that you go to the basement for lab work before leaving today. You have been scheduled for a modified barium swallow on 12/03/14 at 1 pm. Please arrive 15 minutes prior to your test for registration. You will go to [facility] (1st Floor) for your appointment. Please refrain from eating or drinking anything 4 hours prior to your test. Should you need to cancel or reschedule your appointment, please contact [phone] [facility]) or [phone] [place]). _____________________________________________________________________ A Modified Barium Swallow Study, or MBS, is a special x-ray that is taken to check swallowing skills. It is carried out by a Stage manager and a Psychologist, clinical (SLP). During this test, yourmouth, throat, and esophagus, a muscular tube which connects your mouth to your stomach, is checked. The test will help you, your doctor, and the SLP plan what types of foods and liquids are easier for you to swallow. The SLP will also identify positions and ways to help you swallow more easily and safely. What will happen during an MBS? You will be taken to an x-ray room and seated comfortably. You will be asked to swallow small amounts of food and liquid mixed with barium. Barium is a liquid or paste that allows images of your mouth, throat and esophagus to be seen on x-ray. The x-ray captures moving images of the food you are swallowing as it travels from your mouth through your throat and into your esophagus. This test helps identify whether food or liquid is entering your lungs (aspiration). The test also shows which part of your mouth or throat lacks strength or coordination to move the food or liquid in the right direction. This test typically takes 30 minutes to 1 hour to complete. _______________________________________________________________________ We have sent medications to your pharmacy for you to pick up at your convenience. Stop  Viberzi.  You have been scheduled for an endoscopy and colonoscopy. Please follow the written instructions given to you at your visit today. Please pick up your prep supplies at the pharmacy within the next 1-3 days. If you use inhalers (even only as needed), please bring them with you on the day of your procedure. Your physician has requested that you go to www.startemmi.com and enter the access code given to you at your visit today. This web site gives a general overview about your procedure. However, you should still follow specific instructions given to you by our office regarding your preparation for the procedure.   Gastroesophageal Reflux Disease, Adult Gastroesophageal reflux disease (GERD) happens when acid from your stomach flows up into the esophagus. When acid comes in contact with the esophagus, the acid causes soreness (inflammation) in the esophagus. Over time, GERD may create small holes (ulcers) in the lining of the esophagus. CAUSES   Increased body weight. This puts pressure on the stomach, making acid rise from the stomach into the esophagus.  Smoking. This increases acid production in the stomach.  Drinking alcohol. This causes decreased pressure in the lower esophageal sphincter (valve or ring of muscle between the esophagus and stomach), allowing acid from the stomach into the esophagus.  Late evening meals and a full stomach. This increases pressure and acid production in the stomach.  A malformed lower esophageal sphincter. Sometimes, no cause is found. SYMPTOMS   Burning pain in the lower part of the mid-chest behind the breastbone and in the mid-stomach area. This may occur twice a week or more often.  Trouble swallowing.  Sore throat.  Dry cough.  Asthma-like symptoms including chest tightness, shortness of breath, or wheezing. DIAGNOSIS  Your caregiver may be able to diagnose GERD based on your symptoms. In some cases, X-rays and other tests may be done  to check for complications or to check the condition of your stomach and esophagus. TREATMENT  Your caregiver may recommend over-the-counter or prescription medicines to help decrease acid production. Ask your caregiver before starting or adding any new medicines.  HOME CARE INSTRUCTIONS   Change the factors that you can control. Ask your caregiver for guidance concerning weight loss, quitting smoking, and alcohol consumption.  Avoid foods and drinks that make your symptoms worse, such as:  Caffeine or alcoholic drinks.  Chocolate.  Peppermint or mint flavorings.  Garlic and onions.  Spicy foods.  Citrus fruits, such as oranges, lemons, or limes.  Tomato-based foods such as sauce, chili, salsa, and pizza.  Fried and fatty foods.  Avoid lying down for the 3 hours prior to your bedtime or prior to taking a nap.  Eat small, frequent meals instead of large meals.  Wear loose-fitting clothing. Do not wear anything tight around your waist that causes pressure on your stomach.  Raise the head of your bed 6 to 8 inches with wood blocks to help you sleep. Extra pillows will not help.  Only take over-the-counter or prescription medicines for pain, discomfort, or fever as directed by your caregiver.  Do not take aspirin, ibuprofen, or other nonsteroidal anti-inflammatory drugs (NSAIDs). SEEK IMMEDIATE MEDICAL CARE IF:   You have pain in your arms, neck, jaw, teeth, or back.  Your pain increases or changes in intensity or duration.  You develop nausea, vomiting, or sweating (diaphoresis).  You develop shortness of breath, or you faint.  Your vomit is green, yellow, black, or looks like coffee grounds or blood.  Your stool is red, bloody, or black. These symptoms could be signs of other problems, such as heart disease, gastric bleeding, or esophageal bleeding. MAKE SURE YOU:   Understand these instructions.  Will watch your condition.  Will get help right away if you are  not doing well or get worse. Document Released: 03/29/2005 Document Revised: 09/11/2011 Document Reviewed: 01/06/2011 [facility] Patient Information 2015 [place], [place]. This information is not intended to replace advice given to you by your health care provider. Make sure you discuss any questions you have with your health care provider.

## 2014-11-27 LAB — TISSUE TRANSGLUTAMINASE, IGA: Tissue Transglutaminase Ab, IgA: 1 U/mL (ref <4)

## 2014-12-03 ENCOUNTER — Ambulatory Visit (HOSPITAL_COMMUNITY): Payer: 59

## 2014-12-03 ENCOUNTER — Ambulatory Visit (HOSPITAL_COMMUNITY)
Admission: RE | Admit: 2014-12-03 | Discharge: 2014-12-03 | Disposition: A | Discharge status: Home / Self Care | Payer: 59 | Source: Ambulatory Visit | Attending: Physician Assistant | Admitting: Physician Assistant

## 2014-12-03 DIAGNOSIS — R1310 Dysphagia, unspecified: Secondary | ICD-10-CM | POA: Diagnosis present

## 2014-12-03 DIAGNOSIS — R197 Diarrhea, unspecified: Secondary | ICD-10-CM

## 2014-12-03 DIAGNOSIS — K449 Diaphragmatic hernia without obstruction or gangrene: Secondary | ICD-10-CM | POA: Insufficient documentation

## 2014-12-03 DIAGNOSIS — R1314 Dysphagia, pharyngoesophageal phase: Secondary | ICD-10-CM

## 2014-12-03 DIAGNOSIS — K219 Gastro-esophageal reflux disease without esophagitis: Secondary | ICD-10-CM | POA: Insufficient documentation

## 2014-12-03 NOTE — Progress Notes (Signed)
   12/03/14 1503  Swallow Evaluation Recommendations  SLP Diet Recommendations Age appropriate regular solids;Thin  Medication Administration Whole meds with liquid  Supervision Patient able to self feed  Compensations Slow rate;Small sips/bites (consume liquids t/o meal)  Treatment Plan  Oral Care Recommendations Oral care BID  Prognosis  Prognosis for Safe Diet Advancement Good  Individuals Consulted  Consulted and Agree with Results and Recommendations Patient  SLP G-Codes **NOT FOR INPATIENT CLASS**  Functional Limitations Swallowing  Swallow Current Status (S9373) [place]  Swallow Goal Status (S2876) [facility]  Swallow Discharge Status [phone]) [place], [place] SLP [phone]

## 2014-12-31 ENCOUNTER — Encounter: Payer: 59 | Admitting: Internal Medicine

## 2015-01-08 ENCOUNTER — Telehealth: Payer: Self-pay | Admitting: Physician Assistant

## 2015-01-08 NOTE — Telephone Encounter (Signed)
Left a message for patient to call back. 

## 2015-01-08 NOTE — Telephone Encounter (Signed)
Patient states she is not having diarrhea anymore and is not taking anything for it. She is asking if she needs to have the colonoscopy with the EGD. Please, advise.

## 2015-01-13 NOTE — Telephone Encounter (Signed)
Spoke with patient and gave her recommendations. 

## 2015-01-13 NOTE — Telephone Encounter (Signed)
The proc would help with dx, especially if sx come back.

## 2015-03-01 ENCOUNTER — Telehealth: Payer: Self-pay | Admitting: Internal Medicine

## 2015-03-01 NOTE — Telephone Encounter (Signed)
Pt called and cancelled appt for Colon on 03/03/15, only wants to have egd done. Appt for colon cancelled and Dr. Henrene Pastor notified.

## 2015-03-02 NOTE — Telephone Encounter (Signed)
Patient called back wanting to know what she needs to do to prepare for the egd. Paperwork that she had received earlier was for the egd/colonoscopy. Best # [phone]

## 2015-03-02 NOTE — Telephone Encounter (Signed)
Spoke with pt and reviewed prep for EGD with her over the phone and she verbalized understanding.

## 2015-03-03 ENCOUNTER — Ambulatory Visit (AMBULATORY_SURGERY_CENTER): Payer: 59 | Admitting: Internal Medicine

## 2015-03-03 ENCOUNTER — Encounter: Payer: Self-pay | Admitting: Internal Medicine

## 2015-03-03 VITALS — BP 140/75 | HR 74 | Temp 98.9°F | Resp 15 | Ht 62.0 in | Wt 175.0 lb

## 2015-03-03 DIAGNOSIS — R1310 Dysphagia, unspecified: Secondary | ICD-10-CM

## 2015-03-03 DIAGNOSIS — K219 Gastro-esophageal reflux disease without esophagitis: Secondary | ICD-10-CM

## 2015-03-03 DIAGNOSIS — K222 Esophageal obstruction: Secondary | ICD-10-CM

## 2015-03-03 MED ORDER — SODIUM CHLORIDE 0.9 % IV SOLN: 500.0000 mL | INTRAVENOUS | Status: DC

## 2015-03-03 MED ORDER — PANTOPRAZOLE SODIUM 40 MG PO TBEC: 40.0000 mg | DELAYED_RELEASE_TABLET | Freq: Every day | ORAL | Status: DC

## 2015-03-03 NOTE — Op Note (Signed)
[place]  [address] [place], [id number]   ENDOSCOPY PROCEDURE REPORT  PATIENT: Emily Orozco, Emily Orozco  MR#: [id number] BIRTHDATE: 1980-11-01 , 33  yrs. old GENDER: female ENDOSCOPIST: Eustace Quail, MD REFERRED BY:  Janalyn Rouse, M.D. PROCEDURE DATE:  03/03/2015 PROCEDURE:  EGD, diagnostic and Maloney dilation of esophagus -65 French ASA CLASS:     Class II INDICATIONS:  dysphagia and abnormal barium esophagogram. MEDICATIONS: Monitored anesthesia care and Propofol 250 mg IV TOPICAL ANESTHETIC: none  DESCRIPTION OF PROCEDURE: After the risks benefits and alternatives of the procedure were thoroughly explained, informed consent was obtained.  The LB [id number] endoscope was introduced through the mouth and advanced to the second portion of the duodenum , Without limitations.  The instrument was slowly withdrawn as the mucosa was fully examined.  EXAM:The esophagus revealed a ringlike stricture measuring 15 mm at the gastroesophageal junction (36 cm).  No active inflammation. The stomach was normal.  The duodenum was normal.  Retroflexed views revealed a hiatal hernia.     The scope was then withdrawn from the patient and the procedure completed. THERAPY: 54 French Maloney dilator was passed into the esophagus without resistance or heme. Tolerated well  COMPLICATIONS: There were no immediate complications.  ENDOSCOPIC IMPRESSION: 1. Distal esophageal stricture status post dilation-54 French Maloney 2. GERD  RECOMMENDATIONS: 1.  Anti-reflux regimen to be followed 2.  Continue PPI(pantoprazole) 3.  Clear liquids until 4 PM, then soft foods rest of day.  Resume prior diet tomorrow. 4.  Call office next 2-3 days to schedule a follow-up office appointment with Dr. Henrene Pastor in about 8-10 weeks  REPEAT EXAM:  eSigned:  Eustace Quail, MD 03/03/2015 2:17 PM    CC:The Patient and Janalyn Rouse, MD

## 2015-03-03 NOTE — Progress Notes (Signed)
Called to room to assist during endoscopic procedure.  Patient ID and intended procedure confirmed with present staff. Received instructions for my participation in the procedure from the performing physician.  

## 2015-03-03 NOTE — Patient Instructions (Signed)
YOU HAD AN ENDOSCOPIC PROCEDURE TODAY AT [facility]:   Refer to the procedure report that was given to you for any specific questions about what was found during the examination.  If the procedure report does not answer your questions, please call your gastroenterologist to clarify.  If you requested that your care partner not be given the details of your procedure findings, then the procedure report has been included in a sealed envelope for you to review at your convenience later.  YOU SHOULD EXPECT: Some feelings of bloating in the abdomen. Passage of more gas than usual.  Walking can help get rid of the air that was put into your GI tract during the procedure and reduce the bloating. If you had a lower endoscopy (such as a colonoscopy or flexible sigmoidoscopy) you may notice spotting of blood in your stool or on the toilet paper. If you underwent a bowel prep for your procedure, you may not have a normal bowel movement for a few days.  Please Note:  You might notice some irritation and congestion in your nose or some drainage.  This is from the oxygen used during your procedure.  There is no need for concern and it should clear up in a day or so.  SYMPTOMS TO REPORT IMMEDIATELY:     Following upper endoscopy (EGD)  Vomiting of blood or coffee ground material  New chest pain or pain under the shoulder blades  Painful or persistently difficult swallowing  New shortness of breath  Fever of 100F or higher  Black, tarry-looking stools  For urgent or emergent issues, a gastroenterologist can be reached at any hour by calling [phone].   DIET: Follow Dilation Handout   ACTIVITY:  You should plan to take it easy for the rest of today and you should NOT DRIVE or use heavy machinery until tomorrow (because of the sedation medicines used during the test).    FOLLOW UP: Our staff will call the number listed on your records the next business day following your procedure  to check on you and address any questions or concerns that you may have regarding the information given to you following your procedure. If we do not reach you, we will leave a message.  However, if you are feeling well and you are not experiencing any problems, there is no need to return our call.  We will assume that you have returned to your regular daily activities without incident.  If any biopsies were taken you will be contacted by phone or by letter within the next 1-3 weeks.  Please call us at [phone] if you have not heard about the biopsies in 3 weeks.    SIGNATURES/CONFIDENTIALITY: You and/or your care partner have signed paperwork which will be entered into your electronic medical record.  These signatures attest to the fact that that the information above on your After Visit Summary has been reviewed and is understood.  Full responsibility of the confidentiality of this discharge information lies with you and/or your care-partner.  Resume medications, Dilation diet given and Reflux handout given.. Call office to schedule follow-up office appointment with Dr. Henrene Pastor in about 8-10 weeks.

## 2015-03-03 NOTE — Progress Notes (Signed)
Transferred to recovery room. A/O x3, pleased with MAC.  VSS.  Report to Shelia, RN. 

## 2015-03-04 ENCOUNTER — Telehealth: Payer: Self-pay | Admitting: *Deleted

## 2015-03-04 NOTE — Telephone Encounter (Signed)
  Follow up Call-  Call back number 03/03/2015  Post procedure Call Back phone  # [phone] cell  Permission to leave phone message Yes     Patient questions:  Do you have a fever, pain , or abdominal swelling? No. Pain Score  0 *  Have you tolerated food without any problems? Yes.    Have you been able to return to your normal activities? Yes.    Do you have any questions about your discharge instructions: Diet   No. Medications  No. Follow up visit  No.  Do you have questions or concerns about your Care? No.  Actions: * If pain score is 4 or above: No action needed, pain <4.pt did c/o a little sore throat, told pt to swallow a lot and keep moist by drinking liquids and to call us back if this does not continue to improve.

## 2015-05-03 ENCOUNTER — Ambulatory Visit: Payer: 59 | Admitting: Internal Medicine

## 2016-01-06 ENCOUNTER — Telehealth: Payer: Self-pay | Admitting: Internal Medicine

## 2016-01-06 NOTE — Telephone Encounter (Signed)
Pt states she is still having issues with reflux and the medication has not helped. Reports she has gagging and burning. States she has talked to her PCP and they told her to contact GI. Pt scheduled to see Dr. Henrene Pastor 01/10/16@8 :30am. Pt aware of appt.

## 2016-01-06 NOTE — Telephone Encounter (Signed)
Left message for pt to call back  °

## 2016-01-10 ENCOUNTER — Ambulatory Visit: Payer: 59 | Admitting: Internal Medicine

## 2016-01-31 ENCOUNTER — Telehealth: Payer: Self-pay | Admitting: Internal Medicine

## 2016-01-31 NOTE — Telephone Encounter (Signed)
Pt cancelled appt in July. Requesting refill for protonix. Discussed with pt that she needed to be seen to continue with refills on the protonix. Pt scheduled to see Nicoletta Ba PA 02/10/16@11am . Pt aware of appt.

## 2016-02-10 ENCOUNTER — Ambulatory Visit: Payer: 59 | Admitting: Physician Assistant

## 2016-05-15 ENCOUNTER — Encounter (HOSPITAL_COMMUNITY): Payer: Self-pay

## 2016-05-15 ENCOUNTER — Other Ambulatory Visit: Payer: Self-pay

## 2016-05-15 ENCOUNTER — Encounter (HOSPITAL_COMMUNITY)
Admission: RE | Admit: 2016-05-15 | Discharge: 2016-05-15 | Disposition: A | Discharge status: Home / Self Care | Payer: 59 | Source: Ambulatory Visit | Attending: Obstetrics and Gynecology | Admitting: Obstetrics and Gynecology

## 2016-05-15 DIAGNOSIS — Z01818 Encounter for other preprocedural examination: Secondary | ICD-10-CM | POA: Insufficient documentation

## 2016-05-15 HISTORY — DX: Anemia, unspecified: D64.9

## 2016-05-15 LAB — CBC
HEMATOCRIT: 31.2 % — AB (ref 36.0–46.0)
HEMOGLOBIN: 10.3 g/dL — AB (ref 12.0–15.0)
MCH: 28.2 pg (ref 26.0–34.0)
MCHC: 33 g/dL (ref 30.0–36.0)
MCV: 85.5 fL (ref 78.0–100.0)
Platelets: 605 10*3/uL — ABNORMAL HIGH (ref 150–400)
RBC: 3.65 MIL/uL — AB (ref 3.87–5.11)
RDW: 15.5 % (ref 11.5–15.5)
WBC: 7.1 10*3/uL (ref 4.0–10.5)

## 2016-05-15 LAB — COMPREHENSIVE METABOLIC PANEL
ALK PHOS: 56 U/L (ref 38–126)
ALT: 14 U/L (ref 14–54)
AST: 19 U/L (ref 15–41)
Albumin: 3.7 g/dL (ref 3.5–5.0)
Anion gap: 4 — ABNORMAL LOW (ref 5–15)
BILIRUBIN TOTAL: 0.1 mg/dL — AB (ref 0.3–1.2)
BUN: 8 mg/dL (ref 6–20)
CALCIUM: 8.8 mg/dL — AB (ref 8.9–10.3)
CO2: 26 mmol/L (ref 22–32)
CREATININE: 0.79 mg/dL (ref 0.44–1.00)
Chloride: 104 mmol/L (ref 101–111)
Glucose, Bld: 99 mg/dL (ref 65–99)
Potassium: 3.6 mmol/L (ref 3.5–5.1)
Sodium: 134 mmol/L — ABNORMAL LOW (ref 135–145)
TOTAL PROTEIN: 7.4 g/dL (ref 6.5–8.1)

## 2016-05-15 NOTE — Patient Instructions (Addendum)
Your procedure is scheduled on:  Monday, 11/20  Enter through the Main Entrance of [facility] at: 6:45 am  Pick up the phone at the desk and dial [phone].  Call this number if you have problems the morning of surgery: [phone].  Remember: Do NOT eat or drink after midnight Sunday, 11/19  Take these medicines the morning of surgery with a SIP OF WATER:  Protonix, Micardis  Do NOT wear jewelry (body piercing), metal hair clips/bobby pins, make-up, or nail polish.  Do NOT wear lotions, powders, or perfumes.  You may wear deoderant.  Do NOT shave for 48 hours prior to surgery.  Do NOT bring valuables to the hospital.  Leave suitcase in car.  After surgery it may be brought to your room.  For patients admitted to the hospital, checkout time is 11:00 AM the day of discharge. Home with husband Carloyn Manner cell [phone].

## 2016-05-18 NOTE — H&P (Signed)
Emily Orozco is an 35 y.o. female G15P1011 with menorrhagia to anemia and h/o abnormal pap for LAVH/US. Have dw/pt r/b/a, wishes to proceed  Pertinent Gynecological History:  h/o abn pap, last HPV neg, pap WNL 2017 No STD  H/o BTL and OCP use for cycle control  G2P1011 10/2001 SVD, also ectopic  Menstrual History: No LMP recorded.    Past Medical History:  Diagnosis Date  . Allergic rhinitis   . Allergy   . Anemia   . Asthma    Hx: rarely uses inhaler - "have not used it in years"  . Bronchitis    Hx  . 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 Ectopic pregnancy 07/2012  . Encounter for tubal ligation 10/05/2013  . Esophageal stricture   . Gallstones   . GERD (gastroesophageal reflux disease)   . Hiatal hernia   . Hypertension   . IBS (irritable bowel syndrome)   . Obesity, unspecified   . Snoring 11/07/2013  . Unspecified essential hypertension 11/07/2013    Past Surgical History:  Procedure Laterality Date  . CHOLECYSTECTOMY  2009  . CRYOABLATION    . LAPAROSCOPIC TUBAL LIGATION Left 10/06/2013   Procedure: Opertive Laparoscopy; Fulguration of Left Fallopian Tube;  Surgeon: Janyth Contes, MD;  Location: [facility];  Service: Gynecology;  Laterality: Left;  . LAPAROSCOPY  07/29/2012   Procedure: LAPAROSCOPY OPERATIVE;  Surgeon: Melina Schools, MD;  Location: [facility];  Service: Gynecology;  Laterality: Right;  Evacuation Hemoperitoneum, Right Salpingectomy   . TUBAL LIGATION    . UPPER GI ENDOSCOPY      Family History  Problem Relation Age of Onset  . Leukemia Father   . Cancer Father   . Migraines Brother   . Hypertension Mother   . Heart attack      Grandmother  . Hypertension Maternal Grandmother   . Stroke Maternal Grandfather   . Colon cancer Neg Hx   . Esophageal cancer Neg Hx   . Rectal cancer Neg Hx   . Stomach cancer Neg Hx     Social History:  reports that she has never smoked. She has never used smokeless tobacco. She reports that  she drinks alcohol. She reports that she does not use drugs.married, call center  Allergies:  Allergies  Allergen Reactions  . Toradol [Ketorolac Tromethamine] Anaphylaxis    SOB increased heart rate eyes rolled back into head  . Latex Itching  . Penicillins Nausea And Vomiting    Has patient had a PCN reaction causing immediate rash, facial/tongue/throat swelling, SOB or lightheadedness with hypotension: No Has patient had a PCN reaction causing severe rash involving mucus membranes or skin necrosis: No Has patient had a PCN reaction that required hospitalization No Has patient had a PCN reaction occurring within the last 10 years: No If all of the above answers are "NO", then may proceed with Cephalosporin use.  Marland Kitchen Oxycodone Itching and Rash    Amlodipine, Pantprazole, Sprintec, Telasartin    Review of Systems  Constitutional: Negative.   HENT: Negative.   Eyes: Negative.   Respiratory: Negative.   Cardiovascular: Negative.   Gastrointestinal: Negative.   Genitourinary: Negative.   Musculoskeletal: Negative.   Skin: Negative.   Neurological: Negative.   Psychiatric/Behavioral: Negative.     unknown if currently breastfeeding. Physical Exam  Constitutional: She is oriented to person, place, and time. She appears well-developed and well-nourished.  HENT:  Head: Normocephalic and atraumatic.  Cardiovascular: Normal rate and regular  rhythm.   Respiratory: Effort normal and breath sounds normal. No respiratory distress. She has no wheezes.  GI: Soft. Bowel sounds are normal. She exhibits no distension. There is no tenderness.  Musculoskeletal: Normal range of motion.  Neurological: She is alert and oriented to person, place, and time.  Skin: Skin is warm and dry.  Psychiatric: She has a normal mood and affect. Her behavior is normal.    No results found for this or any previous visit (from the past 24 hour(s)).  Nl uterus, simple Ovarian cysts on  [place]  Assessment/Plan: 34yo G2P1011 for LAVH/US secondary to menorrhagia D//w pt r/b/a wishes to proceed Has had general medical clearance Clinda/gent for prophylaxis  Orozco, Emily Leandro 05/18/2016, 3:25 PM

## 2016-05-21 ENCOUNTER — Encounter (HOSPITAL_COMMUNITY): Payer: Self-pay | Admitting: Anesthesiology

## 2016-05-21 MED ORDER — GENTAMICIN SULFATE 40 MG/ML IJ SOLN
INTRAVENOUS | Status: AC
  Administered 2016-05-22: 115 mL via INTRAVENOUS
  Filled 2016-05-21: qty 9.5

## 2016-05-21 NOTE — Anesthesia Preprocedure Evaluation (Addendum)
Anesthesia Evaluation  Patient identified by MRN, date of birth, ID band Patient awake    Reviewed: Allergy & Precautions, NPO status , Patient's Chart, lab work & pertinent test results  Airway Mallampati: II  TM Distance: >3 FB Neck ROM: Full    Dental no notable dental hx. (+) Teeth Intact   Pulmonary asthma ,    Pulmonary exam normal breath sounds clear to auscultation       Cardiovascular hypertension, Pt. on medications Normal cardiovascular exam     Neuro/Psych  Headaches,  Neuromuscular disease negative psych ROS   GI/Hepatic Neg liver ROS, hiatal hernia, GERD  ,  Endo/Other  Obesity  Renal/GU negative Renal ROS  negative genitourinary   Musculoskeletal   Abdominal Normal abdominal exam  (+) - obese,   Peds  Hematology  (+) anemia ,   Anesthesia Other Findings   Reproductive/Obstetrics AUB                               Chemistry      Component Value Date/Time   NA 134 (L) 05/15/2016 1400   K 3.6 05/15/2016 1400   CL 104 05/15/2016 1400   CO2 26 05/15/2016 1400   BUN 8 05/15/2016 1400   CREATININE 0.79 05/15/2016 1400      Component Value Date/Time   CALCIUM 8.8 (L) 05/15/2016 1400   ALKPHOS 56 05/15/2016 1400   AST 19 05/15/2016 1400   ALT 14 05/15/2016 1400   BILITOT 0.1 (L) 05/15/2016 1400     Lab Results  Component Value Date   WBC 7.1 05/15/2016   HGB 10.3 (L) 05/15/2016   HCT 31.2 (L) 05/15/2016   MCV 85.5 05/15/2016   PLT 605 (H) 05/15/2016    Anesthesia Physical Anesthesia Plan  ASA: II  Anesthesia Plan: General   Post-op Pain Management:    Induction: Intravenous and Cricoid pressure planned  Airway Management Planned: Oral ETT  Additional Equipment:   Intra-op Plan:   Post-operative Plan: Extubation in OR  Informed Consent: I have reviewed the patients History and Physical, chart, labs and discussed the procedure including the risks,  benefits and alternatives for the proposed anesthesia with the patient or authorized representative who has indicated his/her understanding and acceptance.   Dental advisory given  Plan Discussed with: Anesthesiologist, CRNA and Surgeon  Anesthesia Plan Comments:         Anesthesia Quick Evaluation

## 2016-05-22 ENCOUNTER — Observation Stay (HOSPITAL_COMMUNITY)
Admission: RE | Admit: 2016-05-22 | Discharge: 2016-05-23 | Disposition: A | Discharge status: Home / Self Care | Payer: 59 | Source: Ambulatory Visit | Attending: Obstetrics and Gynecology | Admitting: Obstetrics and Gynecology

## 2016-05-22 ENCOUNTER — Ambulatory Visit (HOSPITAL_COMMUNITY): Payer: 59 | Admitting: Anesthesiology

## 2016-05-22 ENCOUNTER — Encounter (HOSPITAL_COMMUNITY): Payer: Self-pay

## 2016-05-22 ENCOUNTER — Encounter (HOSPITAL_COMMUNITY)
Admission: RE | Disposition: A | Discharge status: Home / Self Care | Payer: Self-pay | Source: Ambulatory Visit | Attending: Obstetrics and Gynecology

## 2016-05-22 DIAGNOSIS — Z6830 Body mass index (BMI) 30.0-30.9, adult: Secondary | ICD-10-CM | POA: Insufficient documentation

## 2016-05-22 DIAGNOSIS — K219 Gastro-esophageal reflux disease without esophagitis: Secondary | ICD-10-CM | POA: Insufficient documentation

## 2016-05-22 DIAGNOSIS — E669 Obesity, unspecified: Secondary | ICD-10-CM | POA: Diagnosis not present

## 2016-05-22 DIAGNOSIS — Z888 Allergy status to other drugs, medicaments and biological substances status: Secondary | ICD-10-CM | POA: Insufficient documentation

## 2016-05-22 DIAGNOSIS — D649 Anemia, unspecified: Secondary | ICD-10-CM | POA: Insufficient documentation

## 2016-05-22 DIAGNOSIS — I10 Essential (primary) hypertension: Secondary | ICD-10-CM | POA: Diagnosis not present

## 2016-05-22 DIAGNOSIS — N72 Inflammatory disease of cervix uteri: Secondary | ICD-10-CM | POA: Insufficient documentation

## 2016-05-22 DIAGNOSIS — K589 Irritable bowel syndrome without diarrhea: Secondary | ICD-10-CM | POA: Diagnosis not present

## 2016-05-22 DIAGNOSIS — Z880 Allergy status to penicillin: Secondary | ICD-10-CM | POA: Diagnosis not present

## 2016-05-22 DIAGNOSIS — Z885 Allergy status to narcotic agent status: Secondary | ICD-10-CM | POA: Insufficient documentation

## 2016-05-22 DIAGNOSIS — J45909 Unspecified asthma, uncomplicated: Secondary | ICD-10-CM | POA: Insufficient documentation

## 2016-05-22 DIAGNOSIS — Z90710 Acquired absence of both cervix and uterus: Secondary | ICD-10-CM

## 2016-05-22 DIAGNOSIS — N938 Other specified abnormal uterine and vaginal bleeding: Secondary | ICD-10-CM | POA: Diagnosis not present

## 2016-05-22 DIAGNOSIS — Z91040 Latex allergy status: Secondary | ICD-10-CM | POA: Insufficient documentation

## 2016-05-22 DIAGNOSIS — N920 Excessive and frequent menstruation with regular cycle: Secondary | ICD-10-CM | POA: Diagnosis present

## 2016-05-22 HISTORY — PX: LAPAROSCOPIC VAGINAL HYSTERECTOMY WITH SALPINGECTOMY: SHX6680

## 2016-05-22 HISTORY — DX: Acquired absence of both cervix and uterus: Z90.710

## 2016-05-22 LAB — PREGNANCY, URINE: PREG TEST UR: NEGATIVE

## 2016-05-22 SURGERY — HYSTERECTOMY, VAGINAL, LAPAROSCOPY-ASSISTED, WITH SALPINGECTOMY
Anesthesia: General | Site: Abdomen | Laterality: Left

## 2016-05-22 MED ORDER — DIPHENHYDRAMINE HCL 50 MG/ML IJ SOLN: 12.5000 mg | Freq: Four times a day (QID) | INTRAMUSCULAR | Status: DC | PRN

## 2016-05-22 MED ORDER — LACTATED RINGERS IV SOLN
INTRAVENOUS | Status: DC
  Administered 2016-05-22: 12:00:00 via INTRAVENOUS

## 2016-05-22 MED ORDER — ROCURONIUM BROMIDE 100 MG/10ML IV SOLN
INTRAVENOUS | Status: AC
  Filled 2016-05-22: qty 1

## 2016-05-22 MED ORDER — ONDANSETRON HCL 4 MG/2ML IJ SOLN
INTRAMUSCULAR | Status: AC
  Filled 2016-05-22: qty 2

## 2016-05-22 MED ORDER — HYDROMORPHONE 1 MG/ML IV SOLN
INTRAVENOUS | Status: DC
  Administered 2016-05-22: 2 mg via INTRAVENOUS
  Administered 2016-05-22: 12:00:00 via INTRAVENOUS
  Administered 2016-05-23: 0.4 mg via INTRAVENOUS
  Administered 2016-05-23: 0.2 mg via INTRAVENOUS
  Filled 2016-05-22: qty 25

## 2016-05-22 MED ORDER — NON FORMULARY: 40.0000 mg | Freq: Every morning | Status: DC

## 2016-05-22 MED ORDER — HYDROMORPHONE HCL 2 MG PO TABS: 2.0000 mg | ORAL_TABLET | Freq: Four times a day (QID) | ORAL | Status: DC | PRN

## 2016-05-22 MED ORDER — FENTANYL CITRATE (PF) 100 MCG/2ML IJ SOLN: 25.0000 ug | INTRAMUSCULAR | Status: DC | PRN

## 2016-05-22 MED ORDER — SCOPOLAMINE 1 MG/3DAYS TD PT72
MEDICATED_PATCH | TRANSDERMAL | Status: AC
  Administered 2016-05-22: 1.5 mg via TRANSDERMAL
  Filled 2016-05-22: qty 1

## 2016-05-22 MED ORDER — DIPHENHYDRAMINE HCL 12.5 MG/5ML PO ELIX: 12.5000 mg | ORAL_SOLUTION | Freq: Four times a day (QID) | ORAL | Status: DC | PRN

## 2016-05-22 MED ORDER — MIDAZOLAM HCL 2 MG/2ML IJ SOLN
INTRAMUSCULAR | Status: AC
  Filled 2016-05-22: qty 2

## 2016-05-22 MED ORDER — SODIUM CHLORIDE 0.9% FLUSH: 9.0000 mL | INTRAVENOUS | Status: DC | PRN

## 2016-05-22 MED ORDER — LIDOCAINE HCL (CARDIAC) 20 MG/ML IV SOLN
INTRAVENOUS | Status: DC | PRN
  Administered 2016-05-22: 50 mg via INTRAVENOUS

## 2016-05-22 MED ORDER — ONDANSETRON HCL 4 MG/2ML IJ SOLN
4.0000 mg | Freq: Four times a day (QID) | INTRAMUSCULAR | Status: DC | PRN
  Filled 2016-05-22: qty 2

## 2016-05-22 MED ORDER — NALOXONE HCL 0.4 MG/ML IJ SOLN: 0.4000 mg | INTRAMUSCULAR | Status: DC | PRN

## 2016-05-22 MED ORDER — HYDROMORPHONE HCL 1 MG/ML IJ SOLN
INTRAMUSCULAR | Status: DC | PRN
  Administered 2016-05-22: 1 mg via INTRAVENOUS

## 2016-05-22 MED ORDER — SODIUM CHLORIDE 0.9 % IJ SOLN
INTRAMUSCULAR | Status: DC | PRN
  Administered 2016-05-22: 10 mL

## 2016-05-22 MED ORDER — DEXAMETHASONE SODIUM PHOSPHATE 4 MG/ML IJ SOLN
INTRAMUSCULAR | Status: AC
  Filled 2016-05-22: qty 1

## 2016-05-22 MED ORDER — LACTATED RINGERS IV SOLN
INTRAVENOUS | Status: DC
  Administered 2016-05-22 – 2016-05-23 (×2): via INTRAVENOUS

## 2016-05-22 MED ORDER — SODIUM CHLORIDE 0.9 % IV SOLN
INTRAVENOUS | Status: DC | PRN
  Administered 2016-05-22: 20 mL via INTRAMUSCULAR

## 2016-05-22 MED ORDER — ONDANSETRON HCL 4 MG/2ML IJ SOLN
4.0000 mg | Freq: Four times a day (QID) | INTRAMUSCULAR | Status: DC | PRN
  Administered 2016-05-22: 4 mg via INTRAVENOUS

## 2016-05-22 MED ORDER — MEPERIDINE HCL 25 MG/ML IJ SOLN: 6.2500 mg | INTRAMUSCULAR | Status: DC | PRN

## 2016-05-22 MED ORDER — MENTHOL 3 MG MT LOZG
1.0000 | LOZENGE | OROMUCOSAL | Status: DC | PRN
  Filled 2016-05-22: qty 9

## 2016-05-22 MED ORDER — TELMISARTAN 40 MG PO TABS
40.0000 mg | ORAL_TABLET | Freq: Every day | ORAL | Status: DC
  Administered 2016-05-23: 40 mg via ORAL

## 2016-05-22 MED ORDER — ONDANSETRON HCL 4 MG/2ML IJ SOLN
INTRAMUSCULAR | Status: DC | PRN
  Administered 2016-05-22: 4 mg via INTRAVENOUS

## 2016-05-22 MED ORDER — ONDANSETRON HCL 4 MG PO TABS
4.0000 mg | ORAL_TABLET | Freq: Four times a day (QID) | ORAL | Status: DC | PRN
  Filled 2016-05-22: qty 1

## 2016-05-22 MED ORDER — ACETAMINOPHEN 325 MG PO TABS
650.0000 mg | ORAL_TABLET | ORAL | Status: DC | PRN
  Administered 2016-05-22: 650 mg via ORAL
  Filled 2016-05-22: qty 2

## 2016-05-22 MED ORDER — FENTANYL CITRATE (PF) 250 MCG/5ML IJ SOLN
INTRAMUSCULAR | Status: AC
  Filled 2016-05-22: qty 5

## 2016-05-22 MED ORDER — SUGAMMADEX SODIUM 200 MG/2ML IV SOLN
INTRAVENOUS | Status: DC | PRN
  Administered 2016-05-22: 200 mg via INTRAVENOUS

## 2016-05-22 MED ORDER — VASOPRESSIN 20 UNIT/ML IV SOLN
INTRAVENOUS | Status: AC
  Filled 2016-05-22: qty 1

## 2016-05-22 MED ORDER — PROMETHAZINE HCL 25 MG/ML IJ SOLN: 6.2500 mg | INTRAMUSCULAR | Status: DC | PRN

## 2016-05-22 MED ORDER — LACTATED RINGERS IR SOLN
Status: DC | PRN
  Administered 2016-05-22: 3000 mL

## 2016-05-22 MED ORDER — SIMETHICONE 80 MG PO CHEW: 80.0000 mg | CHEWABLE_TABLET | Freq: Four times a day (QID) | ORAL | Status: DC | PRN

## 2016-05-22 MED ORDER — HYDROMORPHONE HCL 1 MG/ML IJ SOLN
INTRAMUSCULAR | Status: AC
  Filled 2016-05-22: qty 1

## 2016-05-22 MED ORDER — PANTOPRAZOLE SODIUM 40 MG PO TBEC
40.0000 mg | DELAYED_RELEASE_TABLET | Freq: Every day | ORAL | Status: DC
  Filled 2016-05-22: qty 1

## 2016-05-22 MED ORDER — FLUTICASONE-SALMETEROL 250-50 MCG/DOSE IN AEPB: 1.0000 | INHALATION_SPRAY | Freq: Two times a day (BID) | RESPIRATORY_TRACT | Status: DC

## 2016-05-22 MED ORDER — DEXAMETHASONE SODIUM PHOSPHATE 10 MG/ML IJ SOLN
INTRAMUSCULAR | Status: DC | PRN
  Administered 2016-05-22: 4 mg via INTRAVENOUS

## 2016-05-22 MED ORDER — ACETAMINOPHEN 10 MG/ML IV SOLN
1000.0000 mg | Freq: Once | INTRAVENOUS | Status: AC
  Administered 2016-05-22: 1000 mg via INTRAVENOUS
  Filled 2016-05-22: qty 100

## 2016-05-22 MED ORDER — SUCCINYLCHOLINE CHLORIDE 200 MG/10ML IV SOSY
PREFILLED_SYRINGE | INTRAVENOUS | Status: AC
  Filled 2016-05-22: qty 10

## 2016-05-22 MED ORDER — HYDROMORPHONE HCL 1 MG/ML IJ SOLN: 0.2000 mg | INTRAMUSCULAR | Status: DC | PRN

## 2016-05-22 MED ORDER — LACTATED RINGERS IV SOLN
INTRAVENOUS | Status: DC
Start: 2016-05-22 — End: 2016-05-22
  Administered 2016-05-22 (×2): via INTRAVENOUS

## 2016-05-22 MED ORDER — SUCCINYLCHOLINE CHLORIDE 20 MG/ML IJ SOLN
INTRAMUSCULAR | Status: DC | PRN
  Administered 2016-05-22: 120 mg via INTRAVENOUS

## 2016-05-22 MED ORDER — SCOPOLAMINE 1 MG/3DAYS TD PT72
1.0000 | MEDICATED_PATCH | Freq: Once | TRANSDERMAL | Status: DC
Start: 2016-05-22 — End: 2016-05-22
  Administered 2016-05-22: 1.5 mg via TRANSDERMAL

## 2016-05-22 MED ORDER — LIDOCAINE HCL (CARDIAC) 20 MG/ML IV SOLN
INTRAVENOUS | Status: AC
  Filled 2016-05-22: qty 5

## 2016-05-22 MED ORDER — BUPIVACAINE HCL (PF) 0.25 % IJ SOLN
INTRAMUSCULAR | Status: AC
  Filled 2016-05-22: qty 30

## 2016-05-22 MED ORDER — MIDAZOLAM HCL 2 MG/2ML IJ SOLN
INTRAMUSCULAR | Status: DC | PRN
  Administered 2016-05-22: 2 mg via INTRAVENOUS

## 2016-05-22 MED ORDER — FENTANYL CITRATE (PF) 100 MCG/2ML IJ SOLN
INTRAMUSCULAR | Status: DC | PRN
  Administered 2016-05-22: 50 ug via INTRAVENOUS
  Administered 2016-05-22 (×2): 100 ug via INTRAVENOUS

## 2016-05-22 MED ORDER — SODIUM CHLORIDE 0.9 % IJ SOLN
INTRAMUSCULAR | Status: AC
  Filled 2016-05-22: qty 100

## 2016-05-22 MED ORDER — BUPIVACAINE HCL (PF) 0.25 % IJ SOLN
INTRAMUSCULAR | Status: DC | PRN
  Administered 2016-05-22: 9 mL

## 2016-05-22 MED ORDER — ROCURONIUM BROMIDE 100 MG/10ML IV SOLN
INTRAVENOUS | Status: DC | PRN
  Administered 2016-05-22: 5 mg via INTRAVENOUS

## 2016-05-22 MED ORDER — GUAIFENESIN 100 MG/5ML PO SOLN: 15.0000 mL | ORAL | Status: DC | PRN

## 2016-05-22 MED ORDER — NON FORMULARY: 1.0000 | Freq: Two times a day (BID) | Status: DC

## 2016-05-22 MED ORDER — ALUM & MAG HYDROXIDE-SIMETH 200-200-20 MG/5ML PO SUSP: 30.0000 mL | ORAL | Status: DC | PRN

## 2016-05-22 MED ORDER — PROPOFOL 10 MG/ML IV BOLUS
INTRAVENOUS | Status: AC
  Filled 2016-05-22: qty 20

## 2016-05-22 MED ORDER — SUGAMMADEX SODIUM 200 MG/2ML IV SOLN
INTRAVENOUS | Status: AC
  Filled 2016-05-22: qty 2

## 2016-05-22 SURGICAL SUPPLY — 36 items
CABLE HIGH FREQUENCY MONO STRZ (ELECTRODE) IMPLANT
CLOTH BEACON ORANGE TIMEOUT ST (SAFETY) ×3 IMPLANT
CONT PATH 16OZ SNAP LID 3702 (MISCELLANEOUS) ×3 IMPLANT
COVER BACK TABLE 60X90IN (DRAPES) ×3 IMPLANT
DECANTER SPIKE VIAL GLASS SM (MISCELLANEOUS) ×4 IMPLANT
DRSG OPSITE POSTOP 3X4 (GAUZE/BANDAGES/DRESSINGS) IMPLANT
DURAPREP 26ML APPLICATOR (WOUND CARE) ×3 IMPLANT
ELECT REM PT RETURN 9FT ADLT (ELECTROSURGICAL) ×3
ELECTRODE REM PT RTRN 9FT ADLT (ELECTROSURGICAL) IMPLANT
FILTER SMOKE EVAC LAPAROSHD (FILTER) ×3 IMPLANT
GLOVE BIO SURGEON STRL SZ 6.5 (GLOVE) ×6 IMPLANT
GLOVE BIO SURGEONS STRL SZ 6.5 (GLOVE) ×3
GLOVE BIOGEL PI IND STRL 7.0 (GLOVE) ×3 IMPLANT
GLOVE BIOGEL PI INDICATOR 7.0 (GLOVE) ×6
LEGGING LITHOTOMY PAIR STRL (DRAPES) ×3 IMPLANT
NEEDLE INSUFFLATION 120MM (ENDOMECHANICALS) ×3 IMPLANT
NS IRRIG 1000ML POUR BTL (IV SOLUTION) ×3 IMPLANT
PACK LAVH (CUSTOM PROCEDURE TRAY) ×3 IMPLANT
PACK ROBOTIC GOWN (GOWN DISPOSABLE) ×3 IMPLANT
PACK TRENDGUARD 450 HYBRID PRO (MISCELLANEOUS) IMPLANT
PROTECTOR NERVE ULNAR (MISCELLANEOUS) ×6 IMPLANT
SET CYSTO W/LG BORE CLAMP LF (SET/KITS/TRAYS/PACK) IMPLANT
SET IRRIG TUBING LAPAROSCOPIC (IRRIGATION / IRRIGATOR) ×2 IMPLANT
SHEARS HARMONIC ACE PLUS 36CM (ENDOMECHANICALS) ×3 IMPLANT
SLEEVE XCEL OPT CAN 5 100 (ENDOMECHANICALS) ×6 IMPLANT
SUT VIC AB 1 CT1 18XBRD ANBCTR (SUTURE) ×2 IMPLANT
SUT VIC AB 1 CT1 8-18 (SUTURE) ×6
SUT VIC AB 2-0 CT1 (SUTURE) ×3 IMPLANT
SUT VIC AB 4-0 PS2 27 (SUTURE) ×3 IMPLANT
SUT VICRYL 0 TIES 12 18 (SUTURE) ×3 IMPLANT
SUT VICRYL 0 UR6 27IN ABS (SUTURE) ×2 IMPLANT
TOWEL OR 17X24 6PK STRL BLUE (TOWEL DISPOSABLE) ×6 IMPLANT
TRAY FOLEY CATH SILVER 14FR (SET/KITS/TRAYS/PACK) ×3 IMPLANT
TRENDGUARD 450 HYBRID PRO PACK (MISCELLANEOUS) ×3
TROCAR XCEL NON-BLD 5MMX100MML (ENDOMECHANICALS) ×3 IMPLANT
WARMER LAPAROSCOPE (MISCELLANEOUS) ×3 IMPLANT

## 2016-05-22 NOTE — Anesthesia Postprocedure Evaluation (Signed)
Anesthesia Post Note  Patient: Emily Orozco  Procedure(s) Performed: Procedure(s) (LRB): LAPAROSCOPIC ASSISTED VAGINAL HYSTERECTOMY W/LEFT SALPINGECTOMY (Left)  Patient location during evaluation: PACU Anesthesia Type: General Level of consciousness: awake and sedated Pain management: pain level controlled Respiratory status: spontaneous breathing Cardiovascular status: stable Postop Assessment: no signs of nausea or vomiting Anesthetic complications: no     Last Vitals:  Vitals:   05/22/16 1030 05/22/16 1045  BP: 128/60 118/66  Pulse: 91 87  Resp: 14 14  Temp: 36.8 C     Last Pain:  Vitals:   05/22/16 0716  TempSrc: Oral   Pain Goal: Patients Stated Pain Goal: Other (Comment) (snoring/restiing with eyes closed) (05/22/16 1030)               [place]

## 2016-05-22 NOTE — Progress Notes (Signed)
Day of Surgery Procedure(s) (LRB): Late entry note from rounding at [facility] W/LEFT SALPINGECTOMY (Left)  Subjective: Patient reports nausea with one episode of emesis.  Improved a bit s/p medication.  Complains of generalized abdominal pain, but using PCA with relief  Objective: I have reviewed patient's vital signs and intake and output.  Good UOP-->2000cc  General: alert and cooperative GI: soft NT Incisions C/D/I  Assessment: s/p Procedure(s): LAPAROSCOPIC ASSISTED VAGINAL HYSTERECTOMY W/LEFT SALPINGECTOMY (Left): stable  Plan: Continue PCA and antiemetics D/c foley in AM since still feeling nausea   LOS: 0 days    Aideliz Garmany W 05/22/2016, 9:50 PM

## 2016-05-22 NOTE — Anesthesia Postprocedure Evaluation (Signed)
Anesthesia Post Note  Patient: Emily Orozco  Procedure(s) Performed: Procedure(s) (LRB): LAPAROSCOPIC ASSISTED VAGINAL HYSTERECTOMY W/LEFT SALPINGECTOMY (Left)  Patient location during evaluation: [facility] Anesthesia Type: General Level of consciousness: awake and alert Pain management: satisfactory to patient Vital Signs Assessment: post-procedure vital signs reviewed and stable Respiratory status: spontaneous breathing and respiratory function stable Cardiovascular status: stable Postop Assessment: adequate PO intake Anesthetic complications: no     Last Vitals:  Vitals:   05/22/16 2147 05/22/16 2148  BP:  (!) 144/79  Pulse:  74  Resp: 18 18  Temp:  36.8 C    Last Pain:  Vitals:   05/22/16 2157  TempSrc:   PainSc: 3    Pain Goal: Patients Stated Pain Goal: 2 (05/22/16 2157)               Katherina Mires

## 2016-05-22 NOTE — Addendum Note (Signed)
Addendum  created 05/22/16 2237 by Flossie Dibble, CRNA   Sign clinical note

## 2016-05-22 NOTE — Brief Op Note (Signed)
05/22/2016  10:38 AM  PATIENT:  Emily Orozco  35 y.o. female  PRE-OPERATIVE DIAGNOSIS:  AUB, anemia  POST-OPERATIVE DIAGNOSIS:  Abnormal uterine bleeding, anemia  PROCEDURE:  Procedure(s): LAPAROSCOPIC ASSISTED VAGINAL HYSTERECTOMY W/LEFT SALPINGECTOMY (Left)  SURGEON:  Surgeon(s) and Role:    * Janyth Contes, MD - Primary  ASSISTANTS: Meisinger, Todd, MD   ANESTHESIA:   local and general  EBL:  Total I/O In: 1600 [I.V.:1600] Out: 450 [Urine:250; Blood:200]  BLOOD ADMINISTERED:none  DRAINS: Urinary Catheter (Foley)   LOCAL MEDICATIONS USED:  MARCAINE     SPECIMEN:  Source of Specimen:  uterus, cervix and Left tube  DISPOSITION OF SPECIMEN:  PATHOLOGY  COUNTS:  YES  TOURNIQUET:  * No tourniquets in log *  DICTATION: .Other Dictation: Dictation Number [id number]  PLAN OF CARE: Admit for overnight observation  PATIENT DISPOSITION:  PACU - hemodynamically stable.   Delay start of Pharmacological VTE agent (>24hrs) due to surgical blood loss or risk of bleeding: not applicable

## 2016-05-22 NOTE — Transfer of Care (Signed)
Immediate Anesthesia Transfer of Care Note  Patient: Emily Orozco  Procedure(s) Performed: Procedure(s): LAPAROSCOPIC ASSISTED VAGINAL HYSTERECTOMY W/LEFT SALPINGECTOMY (Left)  Patient Location: PACU  Anesthesia Type:General  Level of Consciousness: awake, alert  and oriented  Airway & Oxygen Therapy: Patient Spontanous Breathing and Patient connected to nasal cannula oxygen  Post-op Assessment: Report given to RN and Post -op Vital signs reviewed and stable  Post vital signs: Reviewed and stable  Last Vitals:  Vitals:   05/22/16 0716 05/22/16 1030  BP: 128/86 128/60  Pulse: 88 91  Resp: 16 14  Temp: 37.1 C 36.8 C    Last Pain:  Vitals:   05/22/16 0716  TempSrc: Oral      Patients Stated Pain Goal: 5 ([id number])  Complications: No apparent anesthesia complications

## 2016-05-22 NOTE — Anesthesia Procedure Notes (Signed)
Procedure Name: Intubation Date/Time: 05/22/2016 8:42 AM Performed by: Jonna Munro Pre-anesthesia Checklist: Patient identified, Emergency Drugs available, Patient being monitored, Suction available and Timeout performed Patient Re-evaluated:Patient Re-evaluated prior to inductionOxygen Delivery Method: Circle system utilized Preoxygenation: Pre-oxygenation with 100% oxygen Intubation Type: Cricoid Pressure applied, Rapid sequence and IV induction Tube size: 7.0 mm Number of attempts: 1 Airway Equipment and Method: Stylet Placement Confirmation: ETT inserted through vocal cords under direct vision,  positive ETCO2 and breath sounds checked- equal and bilateral Secured at: 22 cm Tube secured with: Tape Dental Injury: Teeth and Oropharynx as per pre-operative assessment

## 2016-05-22 NOTE — Interval H&P Note (Signed)
History and Physical Interval Note:  05/22/2016 7:54 AM  Emily Orozco  has presented today for surgery, with the diagnosis of AUB, anemia  The various methods of treatment have been discussed with the patient and family. After consideration of risks, benefits and other options for treatment, the patient has consented to  Procedure(s): LAPAROSCOPIC ASSISTED VAGINAL HYSTERECTOMY WITH SALPINGECTOMY (LEFT) as a surgical intervention .  The patient's history has been reviewed, patient examined, no change in status, stable for surgery.  I have reviewed the patient's chart and labs.  Questions were answered to the patient's satisfaction.     Bovard-Stuckert, Latise Dilley

## 2016-05-22 NOTE — Interval H&P Note (Signed)
History and Physical Interval Note:  05/22/2016 7:55 AM  Emily Orozco  has presented today for surgery, with the diagnosis of AUB, anemia  The various methods of treatment have been discussed with the patient and family. After consideration of risks, benefits and other options for treatment, the patient has consented to  Procedure(s): LAPAROSCOPIC ASSISTED VAGINAL HYSTERECTOMY WITH SALPINGECTOMY (left) as a surgical intervention .  The patient's history has been reviewed, patient examined, no change in status, stable for surgery.  I have reviewed the patient's chart and labs.  Questions were answered to the patient's satisfaction.     Bovard-Stuckert, Demani Weyrauch

## 2016-05-23 DIAGNOSIS — N920 Excessive and frequent menstruation with regular cycle: Secondary | ICD-10-CM | POA: Diagnosis not present

## 2016-05-23 LAB — CBC
HEMATOCRIT: 27.8 % — AB (ref 36.0–46.0)
HEMOGLOBIN: 9.3 g/dL — AB (ref 12.0–15.0)
MCH: 28.7 pg (ref 26.0–34.0)
MCHC: 33.5 g/dL (ref 30.0–36.0)
MCV: 85.8 fL (ref 78.0–100.0)
Platelets: 429 10*3/uL — ABNORMAL HIGH (ref 150–400)
RBC: 3.24 MIL/uL — ABNORMAL LOW (ref 3.87–5.11)
RDW: 15.9 % — ABNORMAL HIGH (ref 11.5–15.5)
WBC: 12.3 10*3/uL — AB (ref 4.0–10.5)

## 2016-05-23 LAB — BASIC METABOLIC PANEL
ANION GAP: 4 — AB (ref 5–15)
BUN: 6 mg/dL (ref 6–20)
CHLORIDE: 104 mmol/L (ref 101–111)
CO2: 26 mmol/L (ref 22–32)
Calcium: 8.2 mg/dL — ABNORMAL LOW (ref 8.9–10.3)
Creatinine, Ser: 0.8 mg/dL (ref 0.44–1.00)
GFR calc Af Amer: >60 mL/min (ref >60)
GLUCOSE: 109 mg/dL — AB (ref 65–99)
POTASSIUM: 3.9 mmol/L (ref 3.5–5.1)
SODIUM: 134 mmol/L — AB (ref 135–145)

## 2016-05-23 MED ORDER — HYDROMORPHONE HCL 2 MG PO TABS: 2.0000 mg | ORAL_TABLET | Freq: Four times a day (QID) | ORAL | 0 refills | Status: DC | PRN

## 2016-05-23 NOTE — Progress Notes (Signed)
Discharge instructions reviewed with patient. Patient verbalized an understanding of instructions.

## 2016-05-23 NOTE — Progress Notes (Signed)
1 Day Post-Op Procedure(s) (LRB): LAPAROSCOPIC ASSISTED VAGINAL HYSTERECTOMY W/LEFT SALPINGECTOMY (Left)  Subjective: Patient reports incisional pain and tolerating PO.    Objective: I have reviewed patient's vital signs, intake and output, medications and labs.  General: alert and no distress Resp: clear to auscultation bilaterally Cardio: regular rate and rhythm GI: soft, non-tender; bowel sounds normal; no masses,  no organomegaly and incision: clean, dry and intact Extremities: sym, NT  Assessment: s/p Procedure(s): LAPAROSCOPIC ASSISTED VAGINAL HYSTERECTOMY W/LEFT SALPINGECTOMY (Left): stable and progressing well  Plan: Encourage ambulation Advance to PO medication Discontinue IV fluids Discharge home  D/C foley.     LOS: 0 days    Bovard-Stuckert, Treavor Blomquist 05/23/2016, 8:01 AM

## 2016-05-23 NOTE — Discharge Summary (Signed)
Physician Discharge Summary  Patient ID: Emily Orozco MRN: [id number] DOB/AGE: 1980-08-16 35 y.o.  Admit date: 05/22/2016 Discharge date: 05/23/2016  Admission Diagnoses: menorrhagia to anemia, AUB  Discharge Diagnoses:  Principal Problem:   S/P laparoscopic assisted vaginal hysterectomy (LAVH)   Discharged Condition: good  Hospital Course: admitted had LAVH, L salpingectomy, without complication.  Postoperative course uncomplicated.  Before discharge, ambulating, voiding, taolerating po and po meds to control pain.  Labs are stable.    Consults: None  Significant Diagnostic Studies: labs: CBC and CMP  Treatments: surgery: LAVH, L salpingectomy, PCA, IV hydration  Discharge Exam: Blood pressure (!) 122/95, pulse 74, temperature 99.1 F (37.3 C), temperature source Oral, resp. rate 18, height 5\' 2"  (1.575 m), weight 75.3 kg (166 lb), SpO2 100 %, unknown if currently breastfeeding. General appearance: alert and no distress Resp: clear to auscultation bilaterally Cardio: regular rate and rhythm GI: soft, non-tender; bowel sounds normal; no masses,  no organomegaly Extremities: extremities normal, atraumatic, no cyanosis or edema Incision/Wound:C/D/I  Disposition: 01-Home or Self Care  Discharge Instructions    Call MD for:  persistant nausea and vomiting    Complete by:  As directed    Call MD for:  redness, tenderness, or signs of infection (pain, swelling, redness, odor or green/yellow discharge around incision site)    Complete by:  As directed    Call MD for:  severe uncontrolled pain    Complete by:  As directed    Diet - low sodium heart healthy    Complete by:  As directed    Discharge instructions    Complete by:  As directed    Call [phone] with questions or problems   Driving Restrictions    Complete by:  As directed    While taking strong pain meds   Increase activity slowly    Complete by:  As directed    Lifting restrictions    Complete by:  As  directed    No greater than 10-15lbs for 2-3 weeks   May shower / Bathe    Complete by:  As directed    May walk up steps    Complete by:  As directed    Sexual Activity Restrictions    Complete by:  As directed    Pelvic rest - no douching, tampons or sex for 6 weeks       Medication List    TAKE these medications   acetaminophen 500 MG tablet Commonly known as:  TYLENOL Take 500 mg by mouth every 6 (six) hours as needed for mild pain.   FERREX 150 150 MG capsule Generic drug:  iron polysaccharides Take 150 mg by mouth daily.   Fluticasone-Salmeterol 250-50 MCG/DOSE Aepb Commonly known as:  ADVAIR Inhale 1 puff into the lungs once as needed.   HYDROmorphone 2 MG tablet Commonly known as:  DILAUDID Take 1 tablet (2 mg total) by mouth every 6 (six) hours as needed for moderate pain or severe pain.   pantoprazole 40 MG tablet Commonly known as:  PROTONIX Take 1 tablet (40 mg total) by mouth daily.   telmisartan 40 MG tablet Commonly known as:  MICARDIS Take 40 mg by mouth daily.      Follow-up Information    Bovard-Stuckert, Donald Memoli, MD. Schedule an appointment as soon as possible for a visit in 2 week(s).   Specialty:  Obstetrics and Gynecology Why:  for incision check and 6 weeks for full post-op check Contact information: [address]  [place] [address] [phone]           Signed: Janyth Contes 05/23/2016, 9:12 AM

## 2016-05-23 NOTE — Op Note (Signed)
NAMEAnahy, [name]               ACCOUNT NO.:  [id number]  MEDICAL RECORD NO.:  [id number]  LOCATION:  [id number]                          FACILITY:  [facility]  PHYSICIAN:  Thornell Sartorius, MD        DATE OF BIRTH:  September 08, 1980  DATE OF PROCEDURE:  05/22/2016 DATE OF DISCHARGE:                              OPERATIVE REPORT   PREOPERATIVE DIAGNOSES: 1. Abnormal uterine bleeding, menorrhagia. 2. Anemia.  POSTOPERATIVE DIAGNOSES: 1. Abnormal uterine bleeding, menorrhagia. 2. Anemia. 3. Status post hysterectomy.  PROCEDURE:  Laparoscopic-assisted vaginal hysterectomy and left salpingectomy.  SURGEON:  Thornell Sartorius, MD.  ASSISTANTTerri Piedra, Cecilia, DO.  ANESTHESIA:  Local and general.  ESTIMATED BLOOD LOSS:  Approximately 200 mL.  URINE OUTPUT:  250 mL, clear urine at the end of the procedure.  IV FLUIDS:  1600 mL.  COMPLICATIONS:  None.  PATHOLOGY:  Uterus, left tube, and cervix.  PROCEDURE:  After informed consent was reviewed with the patient and her husband, she was taken to the operating room, placed on table in supine position with her feet and the Yellofin stirrups.  General anesthesia was induced and found to be adequate.  She was then prepped and draped in the normal sterile fashion.  Using an open-sided speculum, a Hulka manipulator was placed in her cervix and a catheter was sterilely placed.  The gloves and gown were changed.  Attention was turned to the abdominal portion of the case and approximately 1 mm infraumbilical incision was made using a Veress needle.  Pneumoperitoneum was obtained. After passing the hanging drop test, then with direct visualization, a 5 mm trocar was placed.  The accessory trocars were placed bilaterally under direct visualization.  The uterus was grasped with million dollar grasper and the distal portion of the tube was excised from the left-hand side. This was removed through the trocar.  Attention was turned to ligating the cardinal ligament,  the round ligament to level of the uterine arteries was noted to be hemostatic.  This was first performed on left and then on the right.  The tube had previously been excised due to an ectopic pregnancy and the cardinal ligament, round ligament were ligated using Harmonic Scalpel to level of the uterine artery, a bladder flap was also created.  The pedicles were noted to be hemostatic.  Attention was then turned to the vaginal portion of the case using a heavy weighted speculum and a Sims retractor.  The uterus was then first injected with vasopressin and then circumscribed with Bovie cautery. The pedicles were made at the level of the uterosacral, suture held bilaterally.  Multiple Bites were made to made up with progress from the laparoscopic portion of the case.  This was noted to be hemostatic. The uterosacral ligaments were plicated and the held pedicles were tied together.  The pedicles were noted to be hemostatic.  The cuff was closed with 2-0 Vicryl in a running, locked fashion.  Gloves and gowns were changed.  Attention was turned back to the abdominal portion of the case.  A brief pelvic survey revealed no bleeding, ureters were identified bilaterally.  The patient tolerated the procedure well. Sponge, lap, and needle counts  were correct x2.  The gas was manually drained.  The trocars were removed under direct visualization and using 4-0 Vicryl and Dermabond, the ports were closed.  Bandages were applied. The patient was awakened in stable condition.     Thornell Sartorius, MD     JB/MEDQ  D:  05/22/2016  T:  05/23/2016  Job:  [id number]

## 2016-05-24 ENCOUNTER — Encounter (HOSPITAL_COMMUNITY): Payer: Self-pay | Admitting: Obstetrics and Gynecology

## 2017-05-31 ENCOUNTER — Emergency Department (HOSPITAL_COMMUNITY)
Admission: EM | Admit: 2017-05-31 | Discharge: 2017-05-31 | Disposition: A | Discharge status: Home / Self Care | Payer: 59 | Attending: Emergency Medicine | Admitting: Emergency Medicine

## 2017-05-31 DIAGNOSIS — R0989 Other specified symptoms and signs involving the circulatory and respiratory systems: Secondary | ICD-10-CM | POA: Diagnosis not present

## 2017-05-31 DIAGNOSIS — Z5321 Procedure and treatment not carried out due to patient leaving prior to being seen by health care provider: Secondary | ICD-10-CM | POA: Insufficient documentation

## 2017-05-31 NOTE — ED Notes (Signed)
Pt states to registration that what ever was in her throat is now gone and she is leaving

## 2017-05-31 NOTE — ED Triage Notes (Signed)
Pt brought in by EMS after she choked on a piece of meat  Pt has had 2 episodes of vomiting since but still feels like there is something in her throat  Pt has hiatal hernia that causes her some problems

## 2017-09-25 ENCOUNTER — Ambulatory Visit: Payer: 59 | Admitting: Internal Medicine

## 2017-11-05 ENCOUNTER — Ambulatory Visit: Payer: 59 | Admitting: Internal Medicine

## 2018-11-27 ENCOUNTER — Other Ambulatory Visit: Payer: 59

## 2018-11-27 ENCOUNTER — Telehealth: Payer: Self-pay | Admitting: *Deleted

## 2018-11-27 DIAGNOSIS — Z20822 Contact with and (suspected) exposure to covid-19: Secondary | ICD-10-CM

## 2018-11-27 NOTE — Telephone Encounter (Signed)
Carrolyn Leigh, CMA from [facility], called on behalf of Dr Marton Redwood; he would like to have the pt tested for COVID; the pt can be contacted at [phone]; a message can be left on the voicemail; will attempt to contact pt.

## 2018-11-27 NOTE — Telephone Encounter (Signed)
Attempted to contact pt to schedule testing; left message on voicemail for pt to call back.

## 2018-11-27 NOTE — Telephone Encounter (Signed)
Pt called back; she requests an appointment between 1300 - 1400 today because she is working; pt offered and accepted appointment at [facility] site 11/27/2018 at 1315; pt given address, directions, and instructions that she and all occupants of her vehicle wear a mask; she verbalized understanding; orders placed per protocol; will route to provider for notification,

## 2018-11-27 NOTE — Addendum Note (Signed)
Addended by: Addison Naegeli on: 11/27/2018 09:07 AM   Modules accepted: Orders

## 2018-11-29 LAB — NOVEL CORONAVIRUS, NAA: SARS-CoV-2, NAA: NOT DETECTED

## 2019-08-06 ENCOUNTER — Encounter: Payer: Self-pay | Admitting: Orthopaedic Surgery

## 2019-08-06 ENCOUNTER — Ambulatory Visit: Payer: Self-pay

## 2019-08-06 ENCOUNTER — Other Ambulatory Visit: Payer: Self-pay

## 2019-08-06 ENCOUNTER — Ambulatory Visit (INDEPENDENT_AMBULATORY_CARE_PROVIDER_SITE_OTHER): Payer: 59 | Admitting: Orthopaedic Surgery

## 2019-08-06 DIAGNOSIS — M25551 Pain in right hip: Secondary | ICD-10-CM | POA: Diagnosis not present

## 2019-08-06 MED ORDER — METHYLPREDNISOLONE ACETATE 40 MG/ML IJ SUSP
40.0000 mg | INTRAMUSCULAR | Status: AC | PRN
  Administered 2019-08-06: 10:00:00 40 mg via INTRA_ARTICULAR

## 2019-08-06 MED ORDER — LIDOCAINE HCL 1 % IJ SOLN
3.0000 mL | INTRAMUSCULAR | Status: AC | PRN
  Administered 2019-08-06: 10:00:00 3 mL

## 2019-08-06 NOTE — Progress Notes (Signed)
Office Visit Note   Patient: Emily Orozco           Date of Birth: 1981-02-04           MRN: [id number] Visit Date: 08/06/2019              Requested by: Marton Redwood, MD [id number] [place],  [place] [address] PCP: Marton Redwood, MD   Assessment & Plan: Visit Diagnoses:  1. Pain in right hip     Plan: I spoke to her in length about her x-rays and her diagnosis.  I showed her stretching exercises to try for trochanteric bursitis.  I am concerned about the groin pain on the right side.  I feel that she would best benefit from 2 steroid injections today.  I provided 1 over the trochanteric area of her right hip.  She tolerated this well.  I would like to send her to Dr. Junius Roads for an ultrasound-guided right hip intra-articular steroid injection.  I would also like her to try Voltaren gel over the lateral aspect of her right hip.  All question concerns were answered addressed.  I would like to see her back in about 2 weeks to see how she is done from these injections.  If they help this will be great.  If not I would then recommend a MRI of her right hip.  Follow-Up Instructions: Return in about 2 weeks (around 08/20/2019).   Orders:  Orders Placed This Encounter  Procedures  . Large Joint Inj  . XR HIP UNILAT W OR W/O PELVIS 2-3 VIEWS RIGHT  . US Guided Needle Placement   No orders of the defined types were placed in this encounter.     Procedures: Large Joint Inj: R greater trochanter on 08/06/2019 9:30 AM Indications: pain and diagnostic evaluation Details: 22 G 1.5 in needle, lateral approach  Arthrogram: No  Medications: 3 mL lidocaine 1 %; 40 mg methylPREDNISolone acetate 40 MG/ML Outcome: tolerated well, no immediate complications Procedure, treatment alternatives, risks and benefits explained, specific risks discussed. Consent was given by the patient. Immediately prior to procedure a time out was called to verify the correct patient, procedure, equipment, support  staff and site/side marked as required. Patient was prepped and draped in the usual sterile fashion.       Clinical Data: No additional findings.   Subjective: Chief Complaint  Patient presents with  . Right Hip - Pain  The patient is a very pleasant and active 39 year old female sent from Dr. Brigitte Pulse of [place] to evaluate and treat right hip pain.  It is suspected that she has trochanteric bursitis but she does report significant groin pain on the right side.  She denies any injury.  It has been getting worse for over a year now.  She denies any injuries.  She is not a diabetic.  She denies any knee pain on the right side.  She denies any back issues.  She denies any similar symptoms on her left side.  She does have a family history of several family members having hip replacements in the past.  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  HPI  Review of Systems She currently denies any headache, chest pain, shortness of breath, fever, chills, nausea, vomiting  Objective: Vital Signs: There were no vitals taken for this visit.  Physical Exam She is alert and orient x3 and in no acute distress  Ortho Exam Examination of her left hip and left knee is normal.  Examination of her right knee is normal.  Examination of her right hip does show significant pain with internal and external rotation of the hip.  There is no blocks to rotation and her rotation is full with the right hip but it is painful.  She is significantly tender to palpation of the trochanteric area of the right hip but not down to the knee or the IT band. Specialty Comments:  No specialty comments available.  Imaging: US Guided Needle Placement  Result Date: 08/06/2019 Please see Notes tab for imaging impression.  XR HIP UNILAT W OR W/O PELVIS 2-3 VIEWS RIGHT  Result Date: 08/06/2019 A low AP pelvis and lateral the right hip she has no acute findings in the hip  joint on either right or left hips.  The hip joint is well-maintained with a concentric femoral head and acetabulum.  There are calcifications in her pelvis.  The patient does have a history of a hysterectomy.    PMFS History: Patient Active Problem List   Diagnosis Date Noted  . S/P laparoscopic assisted vaginal hysterectomy (LAVH) 05/22/2016  . Unspecified essential hypertension 11/07/2013  . Snoring 11/07/2013  . Episodic memory loss 11/07/2013  . Migraine with status migrainosus 11/07/2013  . Obesity, unspecified 11/07/2013  . Encounter for tubal ligation 10/05/2013  . CHRONIC RHINITIS 07/25/2007  . ASTHMA, UNSPECIFIED, UNSPECIFIED STATUS 07/25/2007   Past Medical History:  Diagnosis Date  . Allergic rhinitis   . Allergy   . Anemia   . Asthma    Hx: rarely uses inhaler - "have not used it in years"  . Bronchitis    Hx  . 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 Ectopic pregnancy 07/2012  . Encounter for tubal ligation 10/05/2013  . Esophageal stricture   . Gallstones   . GERD (gastroesophageal reflux disease)   . Hiatal hernia   . Hypertension   . IBS (irritable bowel syndrome)   . Obesity, unspecified   . S/P laparoscopic assisted vaginal hysterectomy (LAVH) 05/22/2016  . Snoring 11/07/2013  . Unspecified essential hypertension 11/07/2013    Family History  Problem Relation Age of Onset  . Leukemia Father   . Cancer Father   . Migraines Brother   . Hypertension Mother   . Heart attack Unknown        Grandmother  . Hypertension Maternal Grandmother   . Stroke Maternal Grandfather   . Colon cancer Neg Hx   . Esophageal cancer Neg Hx   . Rectal cancer Neg Hx   . Stomach cancer Neg Hx     Past Surgical History:  Procedure Laterality Date  . CHOLECYSTECTOMY  2009  . CRYOABLATION    . LAPAROSCOPIC TUBAL LIGATION Left 10/06/2013   Procedure: Opertive Laparoscopy; Fulguration of Left Fallopian Tube;  Surgeon: Janyth Contes, MD;  Location: [facility];   Service: Gynecology;  Laterality: Left;  . LAPAROSCOPIC VAGINAL HYSTERECTOMY WITH SALPINGECTOMY Left 05/22/2016   Procedure: LAPAROSCOPIC ASSISTED VAGINAL HYSTERECTOMY W/LEFT SALPINGECTOMY;  Surgeon: Janyth Contes, MD;  Location: [facility];  Service: Gynecology;  Laterality: Left;  . LAPAROSCOPY  07/29/2012   Procedure: LAPAROSCOPY OPERATIVE;  Surgeon: Melina Schools, MD;  Location: [facility];  Service: Gynecology;  Laterality: Right;  Evacuation Hemoperitoneum, Right Salpingectomy   . TUBAL LIGATION    . UPPER GI ENDOSCOPY     Social History   Occupational History  . Not on file  Tobacco Use  . Smoking status: Never Smoker  . Smokeless tobacco: Never Used  Substance and Sexual Activity  . Alcohol use: Yes    Comment: occasional wine  . Drug use: No  . Sexual activity: Yes    Birth control/protection: Surgical

## 2019-08-06 NOTE — Progress Notes (Signed)
Subjective: Patient is here for ultrasound-guided intra-articular right hip injection.   Groin pain keeping her from sleeping well.  Had greater troch injection this morning.  Objective:  Pain with passive flexion and IR.  Procedure: Ultrasound-guided right hip injection: After sterile prep with Betadine, injected 8 cc 1% lidocaine without epinephrine and 40 mg methylprednisolone using a 22-gauge spinal needle, passing the needle through the iliofemoral ligament into the femoral head/neck junction.  Very good immediate relief.

## 2019-08-20 ENCOUNTER — Encounter: Payer: Self-pay | Admitting: Orthopaedic Surgery

## 2019-08-20 ENCOUNTER — Ambulatory Visit (INDEPENDENT_AMBULATORY_CARE_PROVIDER_SITE_OTHER): Payer: 59 | Admitting: Orthopaedic Surgery

## 2019-08-20 ENCOUNTER — Other Ambulatory Visit: Payer: Self-pay

## 2019-08-20 DIAGNOSIS — M25551 Pain in right hip: Secondary | ICD-10-CM

## 2019-08-20 NOTE — Progress Notes (Signed)
The patient comes in for continued follow-up for her right hip.  She does report right hip swelling and pain.  Even clinically can see that right hip looks a little bigger than the left side in terms of just her physical exam.  On exam she still hurts with internal and external rotation all around the trochanteric area of the hip and some of the groin.  She did have an intra-articular steroid injection that provided some relief.  She is worse with activity modification and hip strengthening exercises.  At this point I do feel an MRI is warranted based on the soft tissue swelling worsening around the hip and to assess the cartilage in general.  She still having pain with crossing her leg and squatting and trying to put her shoes and socks on the right side.  Her left hip exam is entirely normal.  I did go over her x-rays again.  There is slight overhang of the acetabulum both sides but even on the plain films we can see there is more soft tissue swelling on the right than the left.  We will obtain an MRI of her right hip at this standpoint and I do feel this is clinically and medically warranted given the failure of conservative treatment and swelling we are seeing.  We will see her back after this MRI.  All question concerns were answered and addressed.

## 2019-08-22 ENCOUNTER — Other Ambulatory Visit: Payer: Self-pay

## 2019-08-22 DIAGNOSIS — M25551 Pain in right hip: Secondary | ICD-10-CM

## 2019-08-25 ENCOUNTER — Telehealth: Payer: Self-pay | Admitting: Orthopaedic Surgery

## 2019-08-25 NOTE — Telephone Encounter (Signed)
Called patient got recording mailbox is full and could not leave message     Left SMS message instead for call back to set up MRI review appointment with Dr. Ninfa Linden

## 2019-09-04 ENCOUNTER — Telehealth: Payer: Self-pay | Admitting: Orthopaedic Surgery

## 2019-09-04 NOTE — Telephone Encounter (Signed)
Called patient left message on voicemail to return call concerning scheduling an MRI review appointment with Dr Ninfa Linden or Artis Delay

## 2019-09-20 ENCOUNTER — Other Ambulatory Visit: Payer: Self-pay

## 2019-09-20 ENCOUNTER — Ambulatory Visit
Admission: RE | Admit: 2019-09-20 | Discharge: 2019-09-20 | Disposition: A | Discharge status: Home / Self Care | Payer: 59 | Source: Ambulatory Visit | Attending: Orthopaedic Surgery | Admitting: Orthopaedic Surgery

## 2019-09-20 DIAGNOSIS — M25551 Pain in right hip: Secondary | ICD-10-CM

## 2019-10-06 ENCOUNTER — Other Ambulatory Visit: Payer: Self-pay

## 2019-10-06 ENCOUNTER — Encounter: Payer: Self-pay | Admitting: Physician Assistant

## 2019-10-06 ENCOUNTER — Ambulatory Visit (INDEPENDENT_AMBULATORY_CARE_PROVIDER_SITE_OTHER): Payer: 59 | Admitting: Physician Assistant

## 2019-10-06 DIAGNOSIS — M25551 Pain in right hip: Secondary | ICD-10-CM

## 2019-10-06 NOTE — Progress Notes (Signed)
   HPI: Emily Orozco returns today to go over the MRI of the right hip.  She states that she mainly just has pain on the lateral aspect of her hip whenever she lies on it.  She does feel that naproxen helps some with her pain.  She is describing no groin pain.  She states she has no pain in the left hip whatsoever.  She does feel that the trochanteric injection and not the deep hip injection did give her some relief from her hip pain.  She has had no formal physical therapy. MRI right hip without contrast dated 09/22/2019 is reviewed with the patient.  No acute findings.  No significant joint effusion.  No labral tear.  No focal periarticular fluid accumulation.  No significant arthritic arthropathy.  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  Impression: Right hip pain  Plan: We will send her to formal physical therapy for IT band stretching, modalities home exercise program.  Like to see her back in 6 weeks to see how well she is doing.  The only other recommendation would be to send her for potential right hip arthroscopy for the femoral acetabular impingement.  However today when discussing her symptoms with her it seems this more likely to be trochanteric bursitis.

## 2020-10-14 ENCOUNTER — Other Ambulatory Visit: Payer: Self-pay | Admitting: Obstetrics and Gynecology

## 2020-10-14 DIAGNOSIS — Z1231 Encounter for screening mammogram for malignant neoplasm of breast: Secondary | ICD-10-CM

## 2020-10-25 ENCOUNTER — Ambulatory Visit: Payer: 59 | Admitting: Nurse Practitioner

## 2020-10-25 NOTE — Progress Notes (Deleted)
10/25/2020 Emily Orozco [id number] [birth year]   CHIEF COMPLAINT: GERD  HISTORY OF PRESENT ILLNESS:  Emily Orozco is a 40 year old female with a past medical history of obesity, asthma, anemia, hiatal hernia, GERD and IBS symptoms. S/P cholecystectomy in 2009, laparoscopic tubal ligation in 2015, laparoscopic right salpingectomy 2014, vaginal hysterectomy with left salpingectomy 2017.   EGD 03/03/2015 by Dr. Henrene Pastor: 1. Distal esophageal stricture status post dilation-54 [address] [place] 2. GERD  Past Medical History:  Diagnosis Date  . Allergic rhinitis   . Allergy   . Anemia   . Asthma    Hx: rarely uses inhaler - "have not used it in years"  . Bronchitis    Hx  . 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 Ectopic pregnancy 07/2012  . Encounter for tubal ligation 10/05/2013  . Esophageal stricture   . Gallstones   . GERD (gastroesophageal reflux disease)   . Hiatal hernia   . Hypertension   . IBS (irritable bowel syndrome)   . Obesity, unspecified   . S/P laparoscopic assisted vaginal hysterectomy (LAVH) 05/22/2016  . Snoring 11/07/2013  . Unspecified essential hypertension 11/07/2013   Past Surgical History:  Procedure Laterality Date  . CHOLECYSTECTOMY  2009  . CRYOABLATION    . LAPAROSCOPIC TUBAL LIGATION Left 10/06/2013   Procedure: Opertive Laparoscopy; Fulguration of Left Fallopian Tube;  Surgeon: Janyth Contes, MD;  Location: [facility];  Service: Gynecology;  Laterality: Left;  . LAPAROSCOPIC VAGINAL HYSTERECTOMY WITH SALPINGECTOMY Left 05/22/2016   Procedure: LAPAROSCOPIC ASSISTED VAGINAL HYSTERECTOMY W/LEFT SALPINGECTOMY;  Surgeon: Janyth Contes, MD;  Location: [facility];  Service: Gynecology;  Laterality: Left;  . LAPAROSCOPY  07/29/2012   Procedure: LAPAROSCOPY OPERATIVE;  Surgeon: Melina Schools, MD;  Location: [facility];  Service: Gynecology;  Laterality: Right;  Evacuation Hemoperitoneum, Right Salpingectomy   . TUBAL LIGATION    .  UPPER GI ENDOSCOPY     Social History:  Family History:    reports that she has never smoked. She has never used smokeless tobacco. She reports current alcohol use. She reports that she does not use drugs. family history includes Cancer in her father; Heart attack in her unknown relative; Hypertension in her maternal grandmother and mother; Leukemia in her father; Migraines in her brother; Stroke in her maternal grandfather. Allergies  Allergen Reactions  . Toradol [Ketorolac Tromethamine] Anaphylaxis    SOB increased heart rate eyes rolled back into head  . Latex Itching  . Penicillins Nausea And Vomiting    Has patient had a PCN reaction causing immediate rash, facial/tongue/throat swelling, SOB or lightheadedness with hypotension: No Has patient had a PCN reaction causing severe rash involving mucus membranes or skin necrosis: No Has patient had a PCN reaction that required hospitalization No Has patient had a PCN reaction occurring within the last 10 years: No If all of the above answers are "NO", then may proceed with Cephalosporin use.  Marland Kitchen Oxycodone Itching and Rash      Outpatient Encounter Medications as of 10/25/2020  Medication Sig  . acetaminophen (TYLENOL) 500 MG tablet Take 500 mg by mouth every 6 (six) hours as needed for mild pain.  Marland Kitchen FERREX 150 150 MG capsule Take 150 mg by mouth daily.  . Fluticasone-Salmeterol (ADVAIR) 250-50 MCG/DOSE AEPB Inhale 1 puff into the lungs once as needed.  Marland Kitchen HYDROmorphone (DILAUDID) 2 MG tablet Take 1 tablet (2 mg total) by mouth every 6 (six) hours as needed for  moderate pain or severe pain.  . pantoprazole (PROTONIX) 40 MG tablet Take 1 tablet (40 mg total) by mouth daily.  Marland Kitchen telmisartan (MICARDIS) 40 MG tablet Take 40 mg by mouth daily.   No facility-administered encounter medications on file as of 10/25/2020.     REVIEW OF SYSTEMS: All other systems reviewed and negative except where noted in the History of Present Illness.  Gen:  Denies fever, sweats or chills. No weight loss.  CV: Denies chest pain, palpitations or edema. Resp: Denies cough, shortness of breath of hemoptysis.  GI: Denies heartburn, dysphagia, stomach or lower abdominal pain. No diarrhea or constipation.  GU : Denies urinary burning, blood in urine, increased urinary frequency or incontinence. MS: Denies joint pain, muscles aches or weakness. Derm: Denies rash, itchiness, skin lesions or unhealing ulcers. Psych: Denies depression, anxiety, memory loss, suicidal ideation and confusion. Heme: Denies bruising, bleeding. Neuro:  Denies headaches, dizziness or paresthesias. Endo:  Denies any problems with DM, thyroid or adrenal function.    PHYSICAL EXAM: There were no vitals taken for this visit. General: Well developed ... in no acute distress. Head: Normocephalic and atraumatic. Eyes:  Sclerae non-icteric, conjunctive pink. Ears: Normal auditory acuity. Mouth: Dentition intact. No ulcers or lesions.  Neck: Supple, no lymphadenopathy or thyromegaly.  Lungs: Clear bilaterally to auscultation without wheezes, crackles or rhonchi. Heart: Regular rate and rhythm. No murmur, rub or gallop appreciated.  Abdomen: Soft, nontender, non distended. No masses. No hepatosplenomegaly. Normoactive bowel sounds x 4 quadrants.  Rectal:  Musculoskeletal: Symmetrical with no gross deformities. Skin: Warm and dry. No rash or lesions on visible extremities. Extremities: No edema. Neurological: Alert oriented x 4, no focal deficits.  Psychological:  Alert and cooperative. Normal mood and affect.  ASSESSMENT AND PLAN:    CC:  Ginger Organ., MD

## 2020-12-06 ENCOUNTER — Other Ambulatory Visit: Payer: Self-pay

## 2020-12-06 ENCOUNTER — Ambulatory Visit
Admission: RE | Admit: 2020-12-06 | Discharge: 2020-12-06 | Disposition: A | Discharge status: Home / Self Care | Payer: 59 | Source: Ambulatory Visit | Attending: Obstetrics and Gynecology | Admitting: Obstetrics and Gynecology

## 2020-12-06 DIAGNOSIS — Z1231 Encounter for screening mammogram for malignant neoplasm of breast: Secondary | ICD-10-CM

## 2021-01-19 ENCOUNTER — Ambulatory Visit: Payer: 59 | Admitting: Physician Assistant

## 2021-12-07 ENCOUNTER — Ambulatory Visit: Payer: 59 | Admitting: Physician Assistant

## 2021-12-27 NOTE — Progress Notes (Signed)
12/27/2021 Emily Orozco [id number] 08-24-1980   CHIEF COMPLAINT: Rectal bleeding, loose stools and abdominal pain  HISTORY OF PRESENT ILLNESS: Emily Orozco is a 41 year female with a past medical history of asthma, hypertension, obesity, anemia, GERD, esophageal stricture and IBS symptoms. Partial hysterectomy and cholecystectomy. She presents to our office today as referred by Dr. Marton Redwood for further evaluation regarding rectal bleeding.  She complains of having central to lower abdominal cramping pain almost every time after eating any foods that she had her gallbladder removed in 2009.  She takes Dicyclomine as needed with some improvement.  She also passes a loose stool once every other day.  No BM on the alternating days.  She takes Pepto-Bismol as needed.  She passes a small to large amount of bright red blood per the rectum which is seen in the toilet water, separate from the stool at least once weekly which she attributes to having hemorrhoids.  In the past, her hemorrhoids would come and go but for the past year she feels a bulge outside of the anal area most days.  She tries to tuck the hemorrhoid tissue back inside. She wears an underwear liner daily and frequently sees a small to moderate amount of bright red blood on the pad.  She has some anorectal irritation, no severe pain.  She uses Preparation H as needed.  She drinks at least 64 ounces of water daily.  She eats a fairly healthy diet.  She avoids Pasta and fried foods.  She has a history of dysphagia and GERD which is well controlled on Esomeprazole 40 mg once daily.  She underwent an EGD 03/03/2015 which showed a distal esophageal stricture which was dilated.  If she skips 1 day of Esomeprazole she develops a cough and dysphagia.  She has intermittent epigastric pain which she attributes to possibly having a hiatal hernia.  Her epigastric pain is infrequent, last occurred yesterday.  No epigastric pain at this time.   Infrequent NSAID use.  No melena.  Labs 12/20/2021: WBC 5.78.  Hemoglobin 12.0.  Hematocrit 34.7.  MCV 94.4.  Platelet 475.  Sodium 138.  Potassium 3.1.  BUN 10.  Creatinine 0.8.  Total bili 0.2.  Alk phos 57.  AST 20.  ALT 22.  Labs 05/24/2021: Hemoglobin 11.8.  Hematocrit 34.7.   EGD 03/03/2015 by Dr. Henrene Pastor: 1. Distal esophageal stricture status post dilation-54 [address] [place] 2. 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   Ectopic pregnancy 07/2012   Encounter for tubal ligation 10/05/2013   Esophageal stricture    Gallstones    GERD (gastroesophageal reflux disease)    Hiatal hernia    Hypertension    IBS (irritable bowel syndrome)    Obesity, unspecified    S/P laparoscopic assisted vaginal hysterectomy (LAVH) 05/22/2016   Snoring 11/07/2013   Unspecified essential hypertension 11/07/2013   Past Surgical History:  Procedure Laterality Date   CHOLECYSTECTOMY  2009   CRYOABLATION     LAPAROSCOPIC TUBAL LIGATION Left 10/06/2013   Procedure: Opertive Laparoscopy; Fulguration of Left Fallopian Tube;  Surgeon: Janyth Contes, MD;  Location: [facility];  Service: Gynecology;  Laterality: Left;   LAPAROSCOPIC VAGINAL  HYSTERECTOMY WITH SALPINGECTOMY Left 05/22/2016   Procedure: LAPAROSCOPIC ASSISTED VAGINAL HYSTERECTOMY W/LEFT SALPINGECTOMY;  Surgeon: Janyth Contes, MD;  Location: [facility];  Service: Gynecology;  Laterality: Left;   LAPAROSCOPY  07/29/2012   Procedure: LAPAROSCOPY OPERATIVE;  Surgeon: Melina Schools, MD;  Location: [facility];  Service: Gynecology;  Laterality: Right;  Evacuation Hemoperitoneum, Right Salpingectomy    SALPINGOOPHORECTOMY Right    TUBAL LIGATION     UPPER GI ENDOSCOPY     Social History: She is married.  She has 1 daughter.  She is a Freight forwarder at [employer].  She drinks  one glass of wine twice monthly.  Non-smoker.  No drug use.   Family History: Mother with history of hypertension.  Father with history of leukemia. Maternal grandfather had prostate and colon cancer. Maternal aunt with Crohn's disease.  Maternal grandfather had a stroke.  Brother with MS.  No known family history of esophageal or gastric cancer.   Allergies  Allergen Reactions   Toradol [Ketorolac Tromethamine] Anaphylaxis    SOB increased heart rate eyes rolled back into head   Latex Itching   Penicillins Nausea And Vomiting    Has patient had a PCN reaction causing immediate rash, facial/tongue/throat swelling, SOB or lightheadedness with hypotension: No Has patient had a PCN reaction causing severe rash involving mucus membranes or skin necrosis: No Has patient had a PCN reaction that required hospitalization No Has patient had a PCN reaction occurring within the last 10 years: No If all of the above answers are "NO", then may proceed with Cephalosporin use.   Oxycodone Itching and Rash      Outpatient Encounter Medications as of 12/28/2021  Medication Sig   acetaminophen (TYLENOL) 500 MG tablet Take 500 mg by mouth every 6 (six) hours as needed for mild pain.   dicyclomine (BENTYL) 10 MG capsule dicyclomine 10 mg capsule   esomeprazole (NEXIUM) 40 MG capsule Take 40 mg by mouth daily at 12 noon.   estradiol (VIVELLE-DOT) 0.1 MG/24HR patch APPLY 1 PATCH TOPICALLY TO THE SKIN 2 TIMES A WEEK   hydrochlorothiazide (HYDRODIURIL) 25 MG tablet    hydrocortisone (ANUSOL-HC) 25 MG suppository Place 25 mg rectally 2 (two) times daily.   hydrocortisone-pramoxine (ANALPRAM HC) 2.5-1 % rectal cream Place 1 application. rectally 4 (four) times daily.   No facility-administered encounter medications on file as of 12/28/2021.    REVIEW OF SYSTEMS:  Gen: + Fatigue. Denies fever, sweats or chills. No weight loss.  CV: Denies chest pain, palpitations or edema. Resp: Denies cough, shortness of  breath of hemoptysis.  GI: See HPI.   GU : Denies urinary burning, blood in urine, increased urinary frequency or incontinence. MS: Denies joint pain, muscles aches or weakness. Derm: Denies rash, itchiness, skin lesions or unhealing ulcers. Psych: Denies depression, anxiety or memory loss. Heme: Denies bruising, easy bleeding. Neuro:  Denies headaches, dizziness or paresthesias. Endo:  Denies any problems with DM, thyroid or adrenal function.  PHYSICAL EXAM: LMP 07/03/2000  BP 129/70   Pulse 85   Ht '5\' 2"'  (1.575 m)   Wt 174 lb (78.9 kg)   LMP 07/03/2000   SpO2 97%   BMI 31.83 kg/m   General: 41 year old female in no acute distress. Head: Normocephalic and atraumatic. Eyes:  Sclerae non-icteric, conjunctive pink. Ears: Normal auditory acuity. Mouth: Dentition intact. No ulcers or lesions.  Neck: Supple, no lymphadenopathy or thyromegaly.  Lungs: Clear bilaterally to auscultation without wheezes, crackles or rhonchi. Heart: Regular rate and rhythm. No murmur,  rub or gallop appreciated.  Abdomen: Soft, mild tenderness above the umbilicus and to the RLQ without rebound or guarding.  Non distended. No masses. No hepatosplenomegaly. Normoactive bowel sounds x 4 quadrants.  Rectal: Small prolapsed right anal hemorrhoid with friable mucosa without active bleeding.  Internal hemorrhoids palpated.  No anal fissure.  No mass.  R[place]onda CMA present during exam. Musculoskeletal: Symmetrical with no gross deformities. Skin: Warm and dry. No rash or lesions on visible extremities. Extremities: No edema. Neurological: Alert oriented x 4, no focal deficits.  Psychological:  Alert and cooperative. Normal mood and affect.  ASSESSMENT AND PLAN:  61) 41 year old female with chronic rectal bleeding, anal hemorrhoids on exam -Apply a small amount of Desitin inside the anal opening and to the external anal area tid as needed for anal or hemorrhoidal irritation/bleeding.  -Diagnostic colonoscopy  benefits and risks discussed including risk with sedation, risk of bleeding, perforation and infection  -Further recommendations to be determined after colonoscopy completed  2) Chronic diarrhea with central to lower abdominal cramping pain since cholecystectomy 2009.  Maternal aunt with history of Crohn's disease. -Patient intends to start align probiotic daily as recommended by her PCP -Consider trial with Cholestyramine if diarrhea persists -Diagnostic colonoscopy as ordered above -Continue Dicyclomine 10 mg 1 tab p.o. 3 times daily as needed  3) GERD, history of a distal esophageal stricture s/p rotation per EGD 02/2015.  GERD symptoms well controlled on Esomeprazole 40 mg daily, however, if she skips 1 dose she develops a cough and dysphagia symptoms. -I discussed scheduling an EGD at the time of her colonoscopy as she is depended on PPI, patient declines EGD at this time. -Continue Esomeprazole 40 mg daily  4) Mild normocytic anemia, chronic rectal bleeding likely a contributing factor. Past partial hysterectomy.  -Patient plans on having a repeat CBC with her PCP within the next month or 2, recommend checking iron panel with next lab draw        CC:  Ginger Organ., MD

## 2021-12-28 ENCOUNTER — Encounter: Payer: Self-pay | Admitting: Nurse Practitioner

## 2021-12-28 ENCOUNTER — Ambulatory Visit (INDEPENDENT_AMBULATORY_CARE_PROVIDER_SITE_OTHER): Payer: 59 | Admitting: Nurse Practitioner

## 2021-12-28 VITALS — BP 129/70 | HR 85 | Ht 62.0 in | Wt 174.0 lb

## 2021-12-28 DIAGNOSIS — R1310 Dysphagia, unspecified: Secondary | ICD-10-CM | POA: Diagnosis not present

## 2021-12-28 DIAGNOSIS — D649 Anemia, unspecified: Secondary | ICD-10-CM

## 2021-12-28 DIAGNOSIS — K625 Hemorrhage of anus and rectum: Secondary | ICD-10-CM

## 2021-12-28 DIAGNOSIS — K219 Gastro-esophageal reflux disease without esophagitis: Secondary | ICD-10-CM | POA: Diagnosis not present

## 2021-12-28 MED ORDER — NA SULFATE-K SULFATE-MG SULF 17.5-3.13-1.6 GM/177ML PO SOLN: 1.0000 | Freq: Once | ORAL | 0 refills | Status: AC

## 2021-12-28 NOTE — Patient Instructions (Addendum)
1)  Apply a small amount of Desitin inside the anal opening and to the external anal area tid x 7 days for anal or hemorrhoidal irritation/bleeding then as needed.  2) Contact Colleen NP in 2 weeks if diarrhea persists, consider trial of Cholestyramine   3) Follow up with Dr. Brigitte Pulse in one month for a repeat CBC, recommend checking iron, iron saturation, TIBC and ferritin levels as well   4) Contact our office if your rectal bleeding worsens   We have sent the following medications to your pharmacy for you to pick up at your convenience: B[place] have been scheduled for an endoscop. Please follow the written instructions given to you at your visit today. Please pick up your prep supplies at the pharmacy within the next 1-3 days. If you use inhalers (even only as needed), please bring them with you on the day of your procedure.  Thank you for choosing me and [facility].  Dr.John Henrene Pastor

## 2021-12-28 NOTE — Progress Notes (Signed)
Assessment and plan noted ?

## 2022-01-02 ENCOUNTER — Encounter: Payer: Self-pay | Admitting: Internal Medicine

## 2022-01-02 ENCOUNTER — Other Ambulatory Visit: Payer: Self-pay | Admitting: Internal Medicine

## 2022-01-02 DIAGNOSIS — E041 Nontoxic single thyroid nodule: Secondary | ICD-10-CM

## 2022-01-09 ENCOUNTER — Ambulatory Visit (AMBULATORY_SURGERY_CENTER): Payer: 59 | Admitting: Internal Medicine

## 2022-01-09 ENCOUNTER — Telehealth: Payer: Self-pay | Admitting: Internal Medicine

## 2022-01-09 ENCOUNTER — Encounter: Payer: Self-pay | Admitting: Internal Medicine

## 2022-01-09 VITALS — BP 122/74 | HR 80 | Temp 96.9°F | Resp 20 | Ht 62.0 in | Wt 174.0 lb

## 2022-01-09 DIAGNOSIS — K648 Other hemorrhoids: Secondary | ICD-10-CM | POA: Diagnosis present

## 2022-01-09 DIAGNOSIS — R197 Diarrhea, unspecified: Secondary | ICD-10-CM

## 2022-01-09 DIAGNOSIS — D649 Anemia, unspecified: Secondary | ICD-10-CM

## 2022-01-09 DIAGNOSIS — K625 Hemorrhage of anus and rectum: Secondary | ICD-10-CM | POA: Diagnosis not present

## 2022-01-09 MED ORDER — SODIUM CHLORIDE 0.9 % IV SOLN: 500.0000 mL | INTRAVENOUS | Status: DC

## 2022-01-09 NOTE — Progress Notes (Signed)
Called to room to assist during endoscopic procedure.  Patient ID and intended procedure confirmed with present staff. Received instructions for my participation in the procedure from the performing physician.  

## 2022-01-09 NOTE — Telephone Encounter (Signed)
Inbound call from patient stating that she had just had a colonoscopy with Dr. Henrene Pastor. She stated that she needed to scheduled a hem band appointment with either Dr. Hilarie Fredrickson, Dr. Carlean Purl or Dr. Havery Moros. I advised patient that the next appointment I had was 8/9 with Dr. Hilarie Fredrickson. Patient stated that was too far out and she needed to be seen sooner. Patient is requesting a call back to discuss if she can be worked in this week or next week. Please advise.

## 2022-01-09 NOTE — Patient Instructions (Signed)
Resume previous diet.  Continue present medications.  Await pathology results.  Please schedule an office appointment for hemorrhoidal banding with Dr. Hilarie Fredrickson, Dr. Carlean Purl, or Dr. Havery Moros.   YOU HAD AN ENDOSCOPIC PROCEDURE TODAY AT [facility]:   Refer to the procedure report that was given to you for any specific questions about what was found during the examination.  If the procedure report does not answer your questions, please call your gastroenterologist to clarify.  If you requested that your care partner not be given the details of your procedure findings, then the procedure report has been included in a sealed envelope for you to review at your convenience later.  YOU SHOULD EXPECT: Some feelings of bloating in the abdomen. Passage of more gas than usual.  Walking can help get rid of the air that was put into your GI tract during the procedure and reduce the bloating. If you had a lower endoscopy (such as a colonoscopy or flexible sigmoidoscopy) you may notice spotting of blood in your stool or on the toilet paper. If you underwent a bowel prep for your procedure, you may not have a normal bowel movement for a few days.  Please Note:  You might notice some irritation and congestion in your nose or some drainage.  This is from the oxygen used during your procedure.  There is no need for concern and it should clear up in a day or so.  SYMPTOMS TO REPORT IMMEDIATELY:  Following lower endoscopy (colonoscopy or flexible sigmoidoscopy):  Excessive amounts of blood in the stool  Significant tenderness or worsening of abdominal pains  Swelling of the abdomen that is new, acute  Fever of 100F or higher   For urgent or emergent issues, a gastroenterologist can be reached at any hour by calling [phone]. Do not use MyChart messaging for urgent concerns.    DIET:  We do recommend a small meal at first, but then you may proceed to your regular diet.  Drink plenty of fluids  but you should avoid alcoholic beverages for 24 hours.  ACTIVITY:  You should plan to take it easy for the rest of today and you should NOT DRIVE or use heavy machinery until tomorrow (because of the sedation medicines used during the test).    FOLLOW UP: Our staff will call the number listed on your records the next business day following your procedure.  We will call around 7:15- 8:00 am to check on you and address any questions or concerns that you may have regarding the information given to you following your procedure. If we do not reach you, we will leave a message.  If you develop any symptoms (ie: fever, flu-like symptoms, shortness of breath, cough etc.) before then, please call [phone].  If you test positive for Covid 19 in the 2 weeks post procedure, please call and report this information to [place].    If any biopsies were taken you will be contacted by phone or by letter within the next 1-3 weeks.  Please call us at [phone] if you have not heard about the biopsies in 3 weeks.    SIGNATURES/CONFIDENTIALITY: You and/or your care partner have signed paperwork which will be entered into your electronic medical record.  These signatures attest to the fact that that the information above on your After Visit Summary has been reviewed and is understood.  Full responsibility of the confidentiality of this discharge information lies with you and/or your care-partner.

## 2022-01-09 NOTE — Progress Notes (Signed)
Sedate, gd SR, tolerated procedure well, VSS, report to RN 

## 2022-01-09 NOTE — Op Note (Signed)
[facility] Patient Name: Emily Orozco Procedure Date: 01/09/2022 4:00 PM MRN: [id number] Endoscopist: Docia Chuck. Henrene Pastor , MD Age: 41 Referring MD:  Date of Birth: 1981/03/23 Gender: Female Account #: [id number] Procedure:                Colonoscopy with biopsies Indications:              Clinically significant diarrhea of unexplained                            origin, Rectal bleeding Medicines:                Monitored Anesthesia Care Procedure:                Pre-Anesthesia Assessment:                           - Prior to the procedure, a History and Physical                            was performed, and patient medications and                            allergies were reviewed. The patient's tolerance of                            previous anesthesia was also reviewed. The risks                            and benefits of the procedure and the sedation                            options and risks were discussed with the patient.                            All questions were answered, and informed consent                            was obtained. Prior Anticoagulants: The patient has                            taken no previous anticoagulant or antiplatelet                            agents. ASA Grade Assessment: II - A patient with                            mild systemic disease. After reviewing the risks                            and benefits, the patient was deemed in                            satisfactory condition to undergo the procedure.  After obtaining informed consent, the colonoscope                            was passed under direct vision. Throughout the                            procedure, the patient's blood pressure, pulse, and                            oxygen saturations were monitored continuously. The                            CF [id number] #[id number] was introduced through the anus                            and advanced to the the  cecum, identified by                            appendiceal orifice and ileocecal valve. The                            ileocecal valve, appendiceal orifice, and rectum                            were photographed. The quality of the bowel                            preparation was excellent. The colonoscopy was                            performed without difficulty. The patient tolerated                            the procedure well. The bowel preparation used was                            SUPREP via split dose instruction. Scope In: 4:07:24 PM Scope Out: 4:24:37 PM Scope Withdrawal Time: 0 hours 12 minutes 32 seconds  Total Procedure Duration: 0 hours 17 minutes 13 seconds  Findings:                 Internal hemorrhoids were found during                            retroflexion. The hemorrhoids were moderate.                           The exam was otherwise without abnormality on                            direct and retroflexion views. Random colon                            biopsies were taken to rule out microscopic colitis. Complications:  No immediate complications. Estimated blood loss:                            None. Estimated Blood Loss:     Estimated blood loss: none. Impression:               - Internal hemorrhoids.                           - The examination was otherwise normal on direct                            and retroflexion views. Recommendation:           - Repeat colonoscopy in 10 years for screening                            purposes.                           - Patient has a contact number available for                            emergencies. The signs and symptoms of potential                            delayed complications were discussed with the                            patient. Return to normal activities tomorrow.                            Written discharge instructions were provided to the                            patient.                            - Resume previous diet.                           - Continue present medications.                           - Follow-up pathology                           - Please schedule an office hemorrhoidal banding                            session with Dr. Meredith Staggers, or [place]. Henrene Pastor, MD 01/09/2022 4:33:10 PM This report has been signed electronically.

## 2022-01-09 NOTE — Progress Notes (Unsigned)
CHIEF COMPLAINT: Rectal bleeding, loose stools and abdominal pain   HISTORY OF PRESENT ILLNESS: Emily Orozco is a 41 year female with a past medical history of asthma, hypertension, obesity, anemia, GERD, esophageal stricture and IBS symptoms. Partial hysterectomy and cholecystectomy. She presents to our office today as referred by Dr. Marton Redwood for further evaluation regarding rectal bleeding.  She complains of having central to lower abdominal cramping pain almost every time after eating any foods that she had her gallbladder removed in 2009.  She takes Dicyclomine as needed with some improvement.  She also passes a loose stool once every other day.  No BM on the alternating days.  She takes Pepto-Bismol as needed.  She passes a small to large amount of bright red blood per the rectum which is seen in the toilet water, separate from the stool at least once weekly which she attributes to having hemorrhoids.  In the past, her hemorrhoids would come and go but for the past year she feels a bulge outside of the anal area most days.  She tries to tuck the hemorrhoid tissue back inside. She wears an underwear liner daily and frequently sees a small to moderate amount of bright red blood on the pad.  She has some anorectal irritation, no severe pain.  She uses Preparation H as needed.  She drinks at least 64 ounces of water daily.  She eats a fairly healthy diet.  She avoids Pasta and fried foods.  She has a history of dysphagia and GERD which is well controlled on Esomeprazole 40 mg once daily.  She underwent an EGD 03/03/2015 which showed a distal esophageal stricture which was dilated.  If she skips 1 day of Esomeprazole she develops a cough and dysphagia.  She has intermittent epigastric pain which she attributes to possibly having a hiatal hernia.  Her epigastric pain is infrequent, last occurred yesterday.  No epigastric pain at this time.  Infrequent NSAID use.  No melena.   Labs 12/20/2021: WBC 5.78.   Hemoglobin 12.0.  Hematocrit 34.7.  MCV 94.4.  Platelet 475.  Sodium 138.  Potassium 3.1.  BUN 10.  Creatinine 0.8.  Total bili 0.2.  Alk phos 57.  AST 20.  ALT 22.   Labs 05/24/2021: Hemoglobin 11.8.  Hematocrit 34.7.   EGD 03/03/2015 by Dr. Henrene Pastor: 1. Distal esophageal stricture status post dilation-54 [place] 2. 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   Ectopic pregnancy 07/2012   Encounter for tubal ligation 10/05/2013   Esophageal stricture     Gallstones     GERD (gastroesophageal reflux disease)     Hiatal hernia     Hypertension     IBS (irritable bowel syndrome)     Obesity, unspecified     S/P laparoscopic assisted vaginal hysterectomy (LAVH) 05/22/2016   Snoring 11/07/2013   Unspecified essential hypertension 11/07/2013         Past Surgical History:  Procedure Laterality Date   CHOLECYSTECTOMY   2009   CRYOABLATION       LAPAROSCOPIC TUBAL LIGATION Left 10/06/2013    Procedure: Opertive Laparoscopy;  Fulguration of Left Fallopian Tube;  Surgeon: Janyth Contes, MD;  Location: [facility];  Service: Gynecology;  Laterality: Left;   LAPAROSCOPIC VAGINAL HYSTERECTOMY WITH SALPINGECTOMY Left 05/22/2016    Procedure: LAPAROSCOPIC ASSISTED VAGINAL HYSTERECTOMY W/LEFT SALPINGECTOMY;  Surgeon: Janyth Contes, MD;  Location: [facility];  Service: Gynecology;  Laterality: Left;   LAPAROSCOPY   07/29/2012    Procedure: LAPAROSCOPY OPERATIVE;  Surgeon: Melina Schools, MD;  Location: [facility];  Service: Gynecology;  Laterality: Right;  Evacuation Hemoperitoneum, Right Salpingectomy     SALPINGOOPHORECTOMY Right     TUBAL LIGATION       UPPER GI ENDOSCOPY        Social History: She is married.  She has 1 daughter.  She is a Freight forwarder at [employer].  She drinks one glass of wine  twice monthly.  Non-smoker.  No drug use.    Family History: Mother with history of hypertension.  Father with history of leukemia. Maternal grandfather had prostate and colon cancer. Maternal aunt with Crohn's disease.  Maternal grandfather had a stroke.  Brother with MS.  No known family history of esophageal or gastric cancer.          Allergies  Allergen Reactions   Toradol [Ketorolac Tromethamine] Anaphylaxis      SOB increased heart rate eyes rolled back into head   Latex Itching   Penicillins Nausea And Vomiting      Has patient had a PCN reaction causing immediate rash, facial/tongue/throat swelling, SOB or lightheadedness with hypotension: No Has patient had a PCN reaction causing severe rash involving mucus membranes or skin necrosis: No Has patient had a PCN reaction that required hospitalization No Has patient had a PCN reaction occurring within the last 10 years: No If all of the above answers are "NO", then may proceed with Cephalosporin use.   Oxycodone Itching and Rash            Outpatient Encounter Medications as of 12/28/2021  Medication Sig   acetaminophen (TYLENOL) 500 MG tablet Take 500 mg by mouth every 6 (six) hours as needed for mild pain.   dicyclomine (BENTYL) 10 MG capsule dicyclomine 10 mg capsule   esomeprazole (NEXIUM) 40 MG capsule Take 40 mg by mouth daily at 12 noon.   estradiol (VIVELLE-DOT) 0.1 MG/24HR patch APPLY 1 PATCH TOPICALLY TO THE SKIN 2 TIMES A WEEK   hydrochlorothiazide (HYDRODIURIL) 25 MG tablet     hydrocortisone (ANUSOL-HC) 25 MG suppository Place 25 mg rectally 2 (two) times daily.   hydrocortisone-pramoxine (ANALPRAM HC) 2.5-1 % rectal cream Place 1 application. rectally 4 (four) times daily.    No facility-administered encounter medications on file as of 12/28/2021.      REVIEW OF SYSTEMS:  Gen: + Fatigue. Denies fever, sweats or chills. No weight loss.  CV: Denies chest pain, palpitations or edema. Resp: Denies cough, shortness  of breath of hemoptysis.  GI: See HPI.   GU : Denies urinary burning, blood in urine, increased urinary frequency or incontinence. MS: Denies joint pain, muscles aches or weakness. Derm: Denies rash, itchiness, skin lesions or unhealing ulcers. Psych: Denies depression, anxiety or memory loss. Heme: Denies bruising, easy bleeding. Neuro:  Denies headaches, dizziness or paresthesias. Endo:  Denies any problems with DM, thyroid or adrenal function.   PHYSICAL EXAM: LMP 07/03/2000  BP 129/70   Pulse 85   Ht '5\' 2"'  (1.575 m)   Wt 174 lb (78.9 kg)   LMP 07/03/2000   SpO2 97%   BMI  31.83 kg/m    General: 40 year old female in no acute distress. Head: Normocephalic and atraumatic. Eyes:  Sclerae non-icteric, conjunctive pink. Ears: Normal auditory acuity. Mouth: Dentition intact. No ulcers or lesions.  Neck: Supple, no lymphadenopathy or thyromegaly.  Lungs: Clear bilaterally to auscultation without wheezes, crackles or rhonchi. Heart: Regular rate and rhythm. No murmur, rub or gallop appreciated.  Abdomen: Soft, mild tenderness above the umbilicus and to the RLQ without rebound or guarding.  Non distended. No masses. No hepatosplenomegaly. Normoactive bowel sounds x 4 quadrants.  Rectal: Small prolapsed right anal hemorrhoid with friable mucosa without active bleeding.  Internal hemorrhoids palpated.  No anal fissure.  No mass.  [place] CMA present during exam. Musculoskeletal: Symmetrical with no gross deformities. Skin: Warm and dry. No rash or lesions on visible extremities. Extremities: No edema. Neurological: Alert oriented x 4, no focal deficits.  Psychological:  Alert and cooperative. Normal mood and affect.   ASSESSMENT AND PLAN:   62) 41 year old female with chronic rectal bleeding, anal hemorrhoids on exam -Apply a small amount of Desitin inside the anal opening and to the external anal area tid as needed for anal or hemorrhoidal irritation/bleeding.  -Diagnostic  colonoscopy benefits and risks discussed including risk with sedation, risk of bleeding, perforation and infection  -Further recommendations to be determined after colonoscopy completed   2) Chronic diarrhea with central to lower abdominal cramping pain since cholecystectomy 2009.  Maternal aunt with history of Crohn's disease. -Patient intends to start align probiotic daily as recommended by her PCP -Consider trial with Cholestyramine if diarrhea persists -Diagnostic colonoscopy as ordered above -Continue Dicyclomine 10 mg 1 tab p.o. 3 times daily as needed   3) GERD, history of a distal esophageal stricture s/p rotation per EGD 02/2015.  GERD symptoms well controlled on Esomeprazole 40 mg daily, however, if she skips 1 dose she develops a cough and dysphagia symptoms. -I discussed scheduling an EGD at the time of her colonoscopy as she is depended on PPI, patient declines EGD at this time. -Continue Esomeprazole 40 mg daily   4) Mild normocytic anemia, chronic rectal bleeding likely a contributing factor. Past partial hysterectomy.  -Patient plans on having a repeat CBC with her PCP within the next month or 2, recommend checking iron panel with next lab draw

## 2022-01-10 ENCOUNTER — Ambulatory Visit (INDEPENDENT_AMBULATORY_CARE_PROVIDER_SITE_OTHER): Payer: 59 | Admitting: Internal Medicine

## 2022-01-10 ENCOUNTER — Telehealth: Payer: Self-pay

## 2022-01-10 ENCOUNTER — Encounter: Payer: Self-pay | Admitting: Internal Medicine

## 2022-01-10 ENCOUNTER — Telehealth: Payer: Self-pay | Admitting: Internal Medicine

## 2022-01-10 VITALS — BP 112/82 | HR 96 | Ht 62.0 in | Wt 175.0 lb

## 2022-01-10 DIAGNOSIS — R195 Other fecal abnormalities: Secondary | ICD-10-CM

## 2022-01-10 DIAGNOSIS — K648 Other hemorrhoids: Secondary | ICD-10-CM | POA: Diagnosis not present

## 2022-01-10 DIAGNOSIS — K602 Anal fissure, unspecified: Secondary | ICD-10-CM

## 2022-01-10 DIAGNOSIS — K649 Unspecified hemorrhoids: Secondary | ICD-10-CM

## 2022-01-10 MED ORDER — DILTIAZEM GEL 2 %: 1.0000 | Freq: Two times a day (BID) | CUTANEOUS | 1 refills | Status: AC

## 2022-01-10 NOTE — Patient Instructions (Addendum)
If you are age 43 or older, your body mass index should be between 23-30. Your Body mass index is 32.01 kg/m. If this is out of the aforementioned range listed, please consider follow up with your Primary Care Provider.  If you are age 43 or younger, your body mass index should be between 19-25. Your Body mass index is 32.01 kg/m. If this is out of the aformentioned range listed, please consider follow up with your Primary Care Provider.   Your provider has prescribed Diltiazem gel 2%  gel for you. Please follow the directions written on your prescription bottle or given to you specifically by your provider. Since this is a specialty medication and is not readily available at most local pharmacies, we have sent your prescription to:  [facility] information is below: Address: [address] [place], [place] [address]  [phone]  *Please DO NOT go directly from our office to pick up this medication! Give the pharmacy 1 day to process the prescription as this is compounded and takes time to make.   The [place] [facility] providers would like to encourage you to use [facility] to communicate with providers for non-urgent requests or questions.  Due to long hold times on the telephone, sending your provider a message by [facility] may be a faster and more efficient way to get a response.  Please allow 48 business hours for a response.  Please remember that this is for non-urgent requests.   It was a pleasure to see you today!  Thank you for trusting me with your gastrointestinal care!    Zenovia Jarred, MD

## 2022-01-10 NOTE — Telephone Encounter (Signed)
  Follow up Call-     01/09/2022    2:43 PM  Call back number  Post procedure Call Back phone  # [phone]  Permission to leave phone message Yes     Patient questions:  Do you have a fever, pain , or abdominal swelling? No. Pain Score  0 *  Have you tolerated food without any problems? Yes.    Have you been able to return to your normal activities? Yes.    Do you have any questions about your discharge instructions: Diet   No. Medications  No. Follow up visit  No.  Do you have questions or concerns about your Care? No.  Actions: * If pain score is 4 or above: No action needed, pain <4.

## 2022-01-10 NOTE — Telephone Encounter (Signed)
This has been addressed. See 01/10/22 telephone encounter

## 2022-01-10 NOTE — Telephone Encounter (Signed)
Dr. Henrene Pastor had a procedure with this patient yesterday and told her he wanted her to have a hemorrhoid banding asap, either with Drs. Pyrtle, Carlean Purl, or Armbruster.  He wants her to have this done due to her constant bleeding.  Please call patient and advise.  Thank you.

## 2022-01-10 NOTE — Telephone Encounter (Signed)
Returned call to patient. Dr. Hilarie Fredrickson had a cancellation for this afternoon at 3:40 pm for a hem banding appt. Pt states that she would be able to make this appt. Pt has been advised to arrive on the 3rd floor by 3:30 pm today. Pt verbalized understanding and had no concerns at the end of the call.

## 2022-01-11 ENCOUNTER — Encounter: Payer: Self-pay | Admitting: Internal Medicine

## 2022-01-11 NOTE — Progress Notes (Signed)
   Subjective:    Patient ID: Emily Orozco, female    DOB: Nov 08, 1980, 41 y.o.   MRN: [id number]  HPI Emily Orozco is a 41 year old female with a history of symptomatic and bleeding internal hemorrhoids, recent colonoscopy without pathology, chronic loose stools, prior cholecystectomy, history of GERD and esophageal stricture who is here to discuss hemorrhoidal banding.  She is here alone today.  She is a patient of Dr. Henrene Pastor.  Again had colonoscopy just the day before on 01/09/2022.  Random biopsies remain pending for microscopic colitis.  Internal hemorrhoids were seen and moderate.  She reports that she has been dealing with loose stools on a regular basis.  This is always postprandial but occurs every day.  Worse since her gallbladder was removed.  Worse with foods like salads.  Frequent rectal bleeding with bowel movement.  Significantly worse over the last 2 months.  Symptoms for hemorrhoids or bleeding and prolapse.  Some pain and burning after passing stool as well.   Review of Systems As per HPI, otherwise negative  Current Medications, Allergies, Past Medical History, Past Surgical History, Family History and Social History were reviewed in [employer] record.    Objective:   Physical Exam BP 112/82   Pulse 96   Ht '5\' 2"'$  (1.575 m)   Wt 175 lb (79.4 kg)   LMP 07/03/2000   BMI 32.01 kg/m  Gen: awake, alert, NAD HEENT: anicteric Abd: soft, NT/ND, +BS throughout Rectal: No external lesions noted, grade 3 internal hemorrhoids which are prolapsing, suspicion for posterior fissure based on discomfort with DRE Neuro: nonfocal     Assessment & Plan:  41 year old female with a history of symptomatic and bleeding internal hemorrhoids, recent colonoscopy without pathology, chronic loose stools, prior cholecystectomy, history of GERD and esophageal stricture who is here to discuss hemorrhoidal banding.   Symptomatic bleeding and prolapsing internal  hemorrhoids --we discussed banding including the risk, benefits and alternatives.  Banding performed today to the right posterior internal hemorrhoid.  See procedure note below.  2.  Anal fissure --based on symptoms and tender DRE. --Improve looser stools, see #3 --Follow-up colonic biopsies to exclude microscopic colitis but suspicion for bile salt diarrhea postcholecystectomy --Diltiazem gel 2% twice daily for 4 to 6 weeks; RectiCare as needed for discomfort  3.  Chronic loose stools --following up microscopic colitis biopsies she will follow with Dr. Henrene Pastor.  If negative she would likely significantly improve with colestipol.   PROCEDURE NOTE:  The patient presents with symptomatic grade 3 internal hemorrhoids, requesting rubber band ligation of her hemorrhoidal disease.  All risks, benefits and alternative forms of therapy were described and informed consent was obtained.   The anorectum was pre-medicated with 0.125% nitroglycerin ointment The decision was made to band the RP internal hemorrhoid, and the [place] was used to perform band ligation without complication.   Digital anorectal examination was then performed to assure proper positioning of the band, and to adjust the banded tissue as required.  The patient was discharged home without pain or other issues.  Dietary and behavioral recommendations were given and along with follow-up instructions.     The following adjunctive treatments were recommended: See above  The patient will return as scheduled for follow-up and possible additional banding as required. No complications were encountered and the patient tolerated the procedure well.

## 2022-01-12 ENCOUNTER — Encounter: Payer: Self-pay | Admitting: Internal Medicine

## 2022-02-14 ENCOUNTER — Ambulatory Visit (INDEPENDENT_AMBULATORY_CARE_PROVIDER_SITE_OTHER): Payer: 59 | Admitting: Internal Medicine

## 2022-02-14 ENCOUNTER — Encounter: Payer: Self-pay | Admitting: Internal Medicine

## 2022-02-14 VITALS — BP 120/78 | HR 79 | Ht 62.0 in | Wt 176.0 lb

## 2022-02-14 DIAGNOSIS — K648 Other hemorrhoids: Secondary | ICD-10-CM | POA: Diagnosis not present

## 2022-02-14 NOTE — Progress Notes (Signed)
Emily Orozco returns today for additional hemorrhoidal banding Visit #1 01/10/2022 banding the right posterior internal hemorrhoid  Symptoms prior to banding included: Frequent rectal bleeding and prolapse Also treated for anal fissure with diltiazem which helps  Since first treatment she has had no further rectal bleeding but continued prolapse requiring manual reduction Fissure symptoms have healed.   PROCEDURE NOTE:  The patient presents with symptomatic grade 3 internal hemorrhoids, requesting rubber band ligation of her hemorrhoidal disease.  All risks, benefits and alternative forms of therapy were described and informed consent was obtained.   The anorectum was pre-medicated with 0.125% nitroglycerin ointment The decision was made to band the LL (RP banded visit #1) internal hemorrhoid, and the [place] was used to perform band ligation without complication.   Digital anorectal examination was then performed to assure proper positioning of the band, and to adjust the banded tissue as required.  The patient was discharged home without pain or other issues.  Dietary and behavioral recommendations were given and along with follow-up instructions.     It should be noted the band required adjustment and reduction after initial placement.  The patient will return as scheduled for follow-up and possible additional banding as required. No complications were encountered and the patient tolerated the procedure well.

## 2022-02-14 NOTE — Patient Instructions (Signed)

## 2022-03-21 ENCOUNTER — Encounter: Payer: Self-pay | Admitting: Internal Medicine

## 2022-03-21 ENCOUNTER — Ambulatory Visit (INDEPENDENT_AMBULATORY_CARE_PROVIDER_SITE_OTHER): Payer: 59 | Admitting: Internal Medicine

## 2022-03-21 VITALS — BP 138/94 | HR 92 | Ht 62.0 in | Wt 182.2 lb

## 2022-03-21 DIAGNOSIS — K648 Other hemorrhoids: Secondary | ICD-10-CM | POA: Diagnosis not present

## 2022-03-21 DIAGNOSIS — K602 Anal fissure, unspecified: Secondary | ICD-10-CM | POA: Diagnosis not present

## 2022-03-21 NOTE — Progress Notes (Signed)
Tiffanyann returns today for additional hemorrhoidal banding Visit #1 01/10/2022 and visit #2 02/14/2022.  Symptoms prior to treatment including: Frequent rectal bleeding and prolapse. Previous anal fissure treated with diltiazem which was effective  She just returned from [place] where she had an excellent medication.  Hemorrhoidal symptoms are significantly improved though she still has some minor prolapse and very rare bleeding   PROCEDURE NOTE:  The patient presents with symptomatic grade 3 internal hemorrhoids, requesting rubber band ligation of her hemorrhoidal disease.  All risks, benefits and alternative forms of therapy were described and informed consent was obtained.   The anorectum was pre-medicated with 0.125% nitroglycerin ointment The decision was made to band the RA internal hemorrhoid, and the [place] was used to perform band ligation without complication.   Digital anorectal examination was then performed to assure proper positioning of the band, and to adjust the banded tissue as required.  The patient was discharged home without pain or other issues.  Dietary and behavioral recommendations were given and along with follow-up instructions.     The following adjunctive treatments were recommended: Post banding discomfort treated with loosening the band but ultimately most effective with RectiCare.  Suspect recurrent anal fissure. -- Diltiazem gel as previously prescribed  The patient will return as needed for follow-up with Dr. Henrene Pastor No complications were encountered and the patient tolerated the procedure well.

## 2022-03-21 NOTE — Patient Instructions (Signed)
_______________________________________________________  If you are age 43 or older, your body mass index should be between 23-30. Your Body mass index is 33.33 kg/m. If this is out of the aforementioned range listed, please consider follow up with your Primary Care Provider.  If you are age 78 or younger, your body mass index should be between 19-25. Your Body mass index is 33.33 kg/m. If this is out of the aformentioned range listed, please consider follow up with your Primary Care Provider.   ________________________________________________________  The [place] [facility] providers would like to encourage you to use [facility] to communicate with providers for non-urgent requests or questions.  Due to long hold times on the telephone, sending your provider a message by [facility] may be a faster and more efficient way to get a response.  Please allow 48 business hours for a response.  Please remember that this is for non-urgent requests.  _______________________________________________________  [facility] Jew PROCEDURE    FOLLOW-UP CARE   The procedure you have had should have been relatively painless since the banding of the area involved does not have nerve endings and there is no pain sensation.  The rubber band cuts off the blood supply to the hemorrhoid and the band may fall off as soon as 48 hours after the banding (the band may occasionally be seen in the toilet bowl following a bowel movement). You may notice a temporary feeling of fullness in the rectum which should respond adequately to plain Tylenol or Motrin.  Following the banding, avoid strenuous exercise that evening and resume full activity the next day.  A sitz bath (soaking in a warm tub) or bidet is soothing, and can be useful for cleansing the area after bowel movements.     To avoid constipation, take two tablespoons of natural wheat bran, natural oat bran, flax, Benefiber or any over the counter fiber supplement and increase  your water intake to 7-8 glasses daily.    Unless you have been prescribed anorectal medication, do not put anything inside your rectum for two weeks: No suppositories, enemas, fingers, etc.  Occasionally, you may have more bleeding than usual after the banding procedure.  This is often from the untreated hemorrhoids rather than the treated one.  Don't be concerned if there is a tablespoon or so of blood.  If there is more blood than this, lie flat with your bottom higher than your head and apply an ice pack to the area. If the bleeding does not stop within a half an hour or if you feel faint, call our office at [phone] or go to the emergency room.  Problems are not common; however, if there is a substantial amount of bleeding, severe pain, chills, fever or difficulty passing urine (very rare) or other problems, you should call us at [phone] or report to the nearest emergency room.  Do not stay seated continuously for more than 2-3 hours for a day or two after the procedure.  Tighten your buttock muscles 10-15 times every two hours and take 10-15 deep breaths every 1-2 hours.  Do not spend more than a few minutes on the toilet if you cannot empty your bowel; instead re-visit the toilet at a later time.

## 2022-03-27 ENCOUNTER — Ambulatory Visit
Admission: RE | Admit: 2022-03-27 | Discharge: 2022-03-27 | Disposition: A | Discharge status: Home / Self Care | Payer: 59 | Source: Ambulatory Visit | Attending: Internal Medicine | Admitting: Internal Medicine

## 2022-03-27 DIAGNOSIS — E041 Nontoxic single thyroid nodule: Secondary | ICD-10-CM

## 2022-03-30 ENCOUNTER — Other Ambulatory Visit: Payer: Self-pay | Admitting: Internal Medicine

## 2022-03-30 DIAGNOSIS — E041 Nontoxic single thyroid nodule: Secondary | ICD-10-CM

## 2022-05-03 ENCOUNTER — Other Ambulatory Visit (HOSPITAL_COMMUNITY)
Admission: RE | Admit: 2022-05-03 | Discharge: 2022-05-03 | Disposition: A | Discharge status: Home / Self Care | Payer: 59 | Source: Ambulatory Visit | Attending: Internal Medicine | Admitting: Internal Medicine

## 2022-05-03 ENCOUNTER — Other Ambulatory Visit: Payer: 59

## 2022-05-03 ENCOUNTER — Ambulatory Visit
Admission: RE | Admit: 2022-05-03 | Discharge: 2022-05-03 | Disposition: A | Discharge status: Home / Self Care | Payer: 59 | Source: Ambulatory Visit | Attending: Internal Medicine | Admitting: Internal Medicine

## 2022-05-03 DIAGNOSIS — E041 Nontoxic single thyroid nodule: Secondary | ICD-10-CM | POA: Diagnosis present

## 2022-05-05 LAB — CYTOLOGY - NON PAP

## 2022-07-07 ENCOUNTER — Other Ambulatory Visit: Payer: Self-pay | Admitting: Internal Medicine

## 2022-07-07 DIAGNOSIS — Z1231 Encounter for screening mammogram for malignant neoplasm of breast: Secondary | ICD-10-CM

## 2022-07-19 ENCOUNTER — Other Ambulatory Visit: Payer: Self-pay | Admitting: Internal Medicine

## 2022-07-19 DIAGNOSIS — N6320 Unspecified lump in the left breast, unspecified quadrant: Secondary | ICD-10-CM

## 2022-07-26 ENCOUNTER — Other Ambulatory Visit: Payer: Self-pay | Admitting: Internal Medicine

## 2022-07-26 ENCOUNTER — Ambulatory Visit
Admission: RE | Admit: 2022-07-26 | Discharge: 2022-07-26 | Disposition: A | Discharge status: Home / Self Care | Payer: 59 | Source: Ambulatory Visit | Attending: Internal Medicine | Admitting: Internal Medicine

## 2022-07-26 DIAGNOSIS — N6320 Unspecified lump in the left breast, unspecified quadrant: Secondary | ICD-10-CM

## 2022-07-26 DIAGNOSIS — N6002 Solitary cyst of left breast: Secondary | ICD-10-CM

## 2022-07-31 ENCOUNTER — Ambulatory Visit
Admission: RE | Admit: 2022-07-31 | Discharge: 2022-07-31 | Disposition: A | Discharge status: Home / Self Care | Payer: 59 | Source: Ambulatory Visit | Attending: Internal Medicine | Admitting: Internal Medicine

## 2022-07-31 DIAGNOSIS — N6002 Solitary cyst of left breast: Secondary | ICD-10-CM

## 2022-11-22 IMAGING — MG MM DIGITAL SCREENING BILAT W/ TOMO AND CAD
8 series · 8 of 24 positions shown · non-contrast
Comparison: None.

CLINICAL DATA: Screening.

EXAM:
DIGITAL SCREENING BILATERAL MAMMOGRAM WITH TOMOSYNTHESIS AND CAD
TECHNIQUE: Bilateral screening digital craniocaudal and mediolateral oblique
mammograms were obtained. Bilateral screening digital breast
tomosynthesis was performed. The images were evaluated with
computer-aided detection.

[R CC synth-2D]
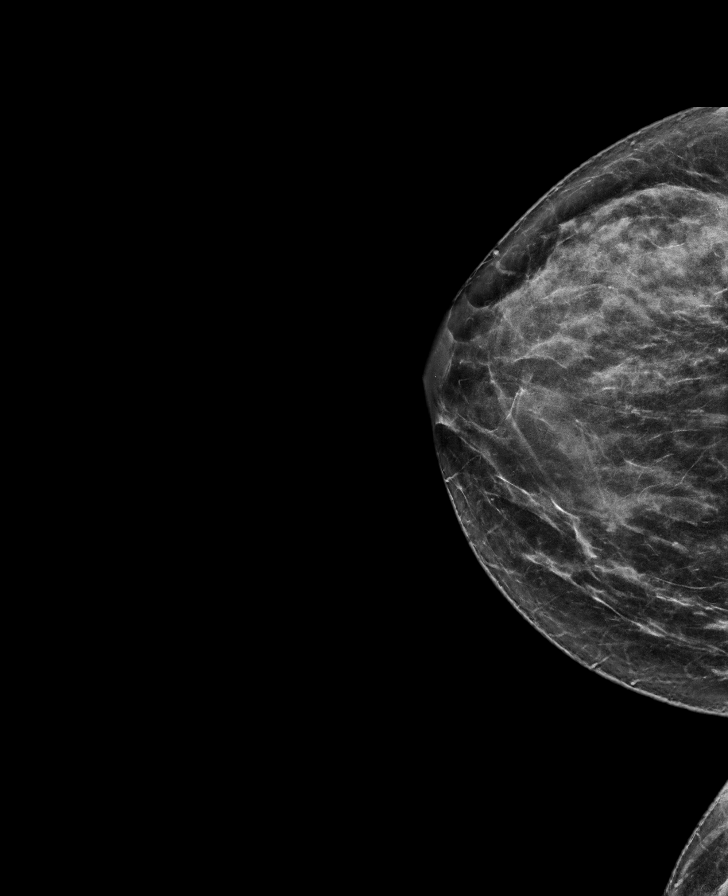

[L MLO synth-2D]
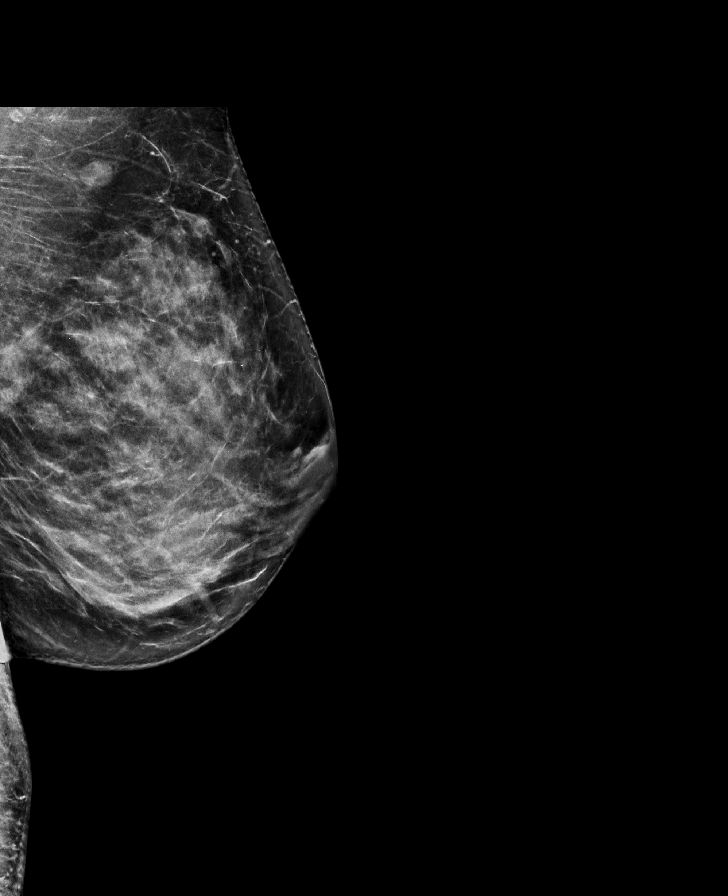

[L CC synth-2D]
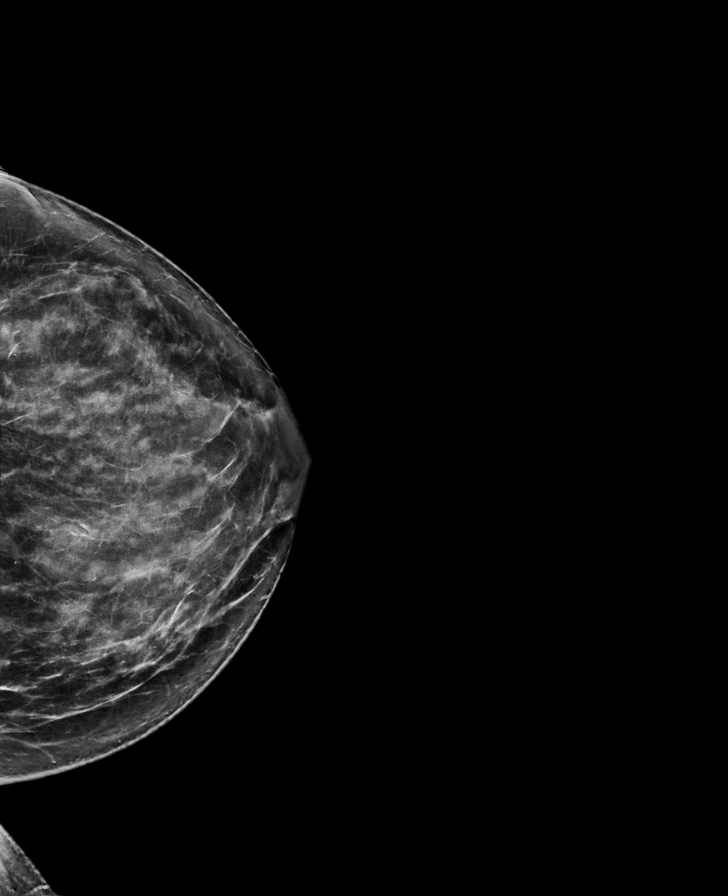

[R MLO synth-2D]
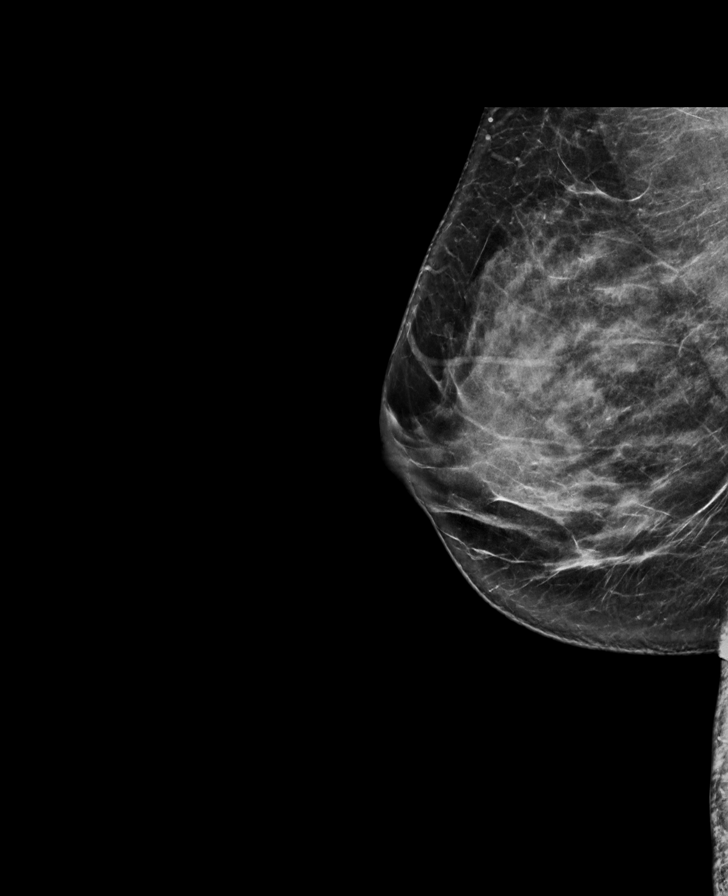

[R CC tomo · tomo slice 37/74.0]
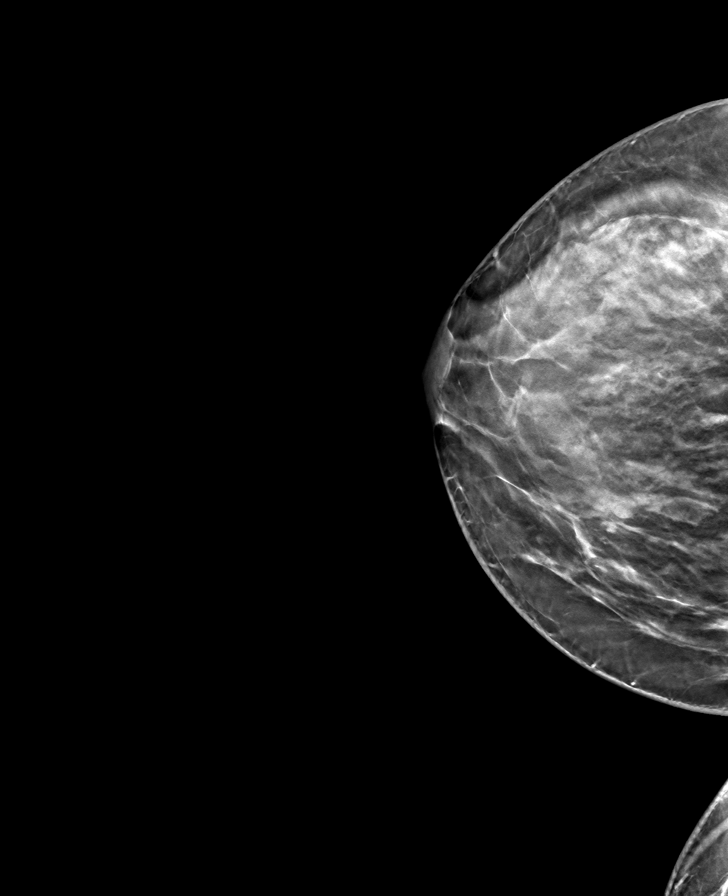

[L CC tomo · tomo slice 41/81.0]
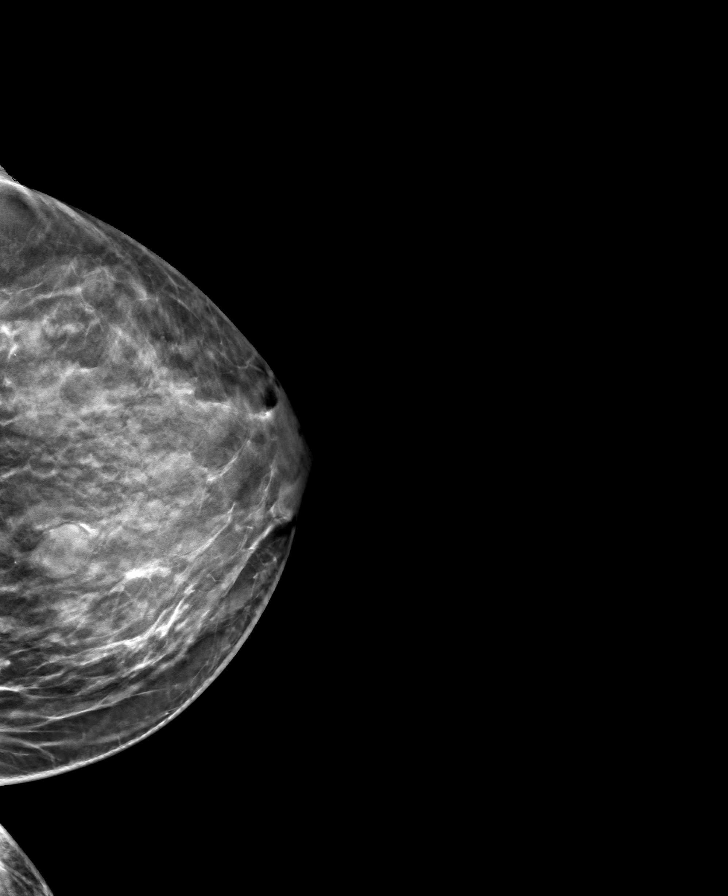

[R MLO tomo · tomo slice 41/82.0]
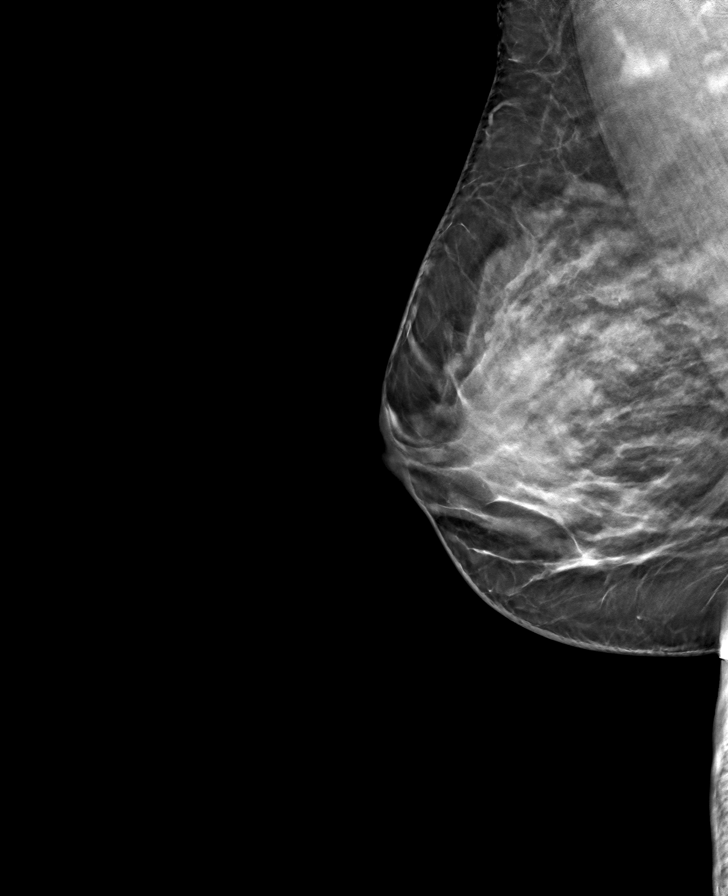

[L MLO tomo · tomo slice 41/81.0]
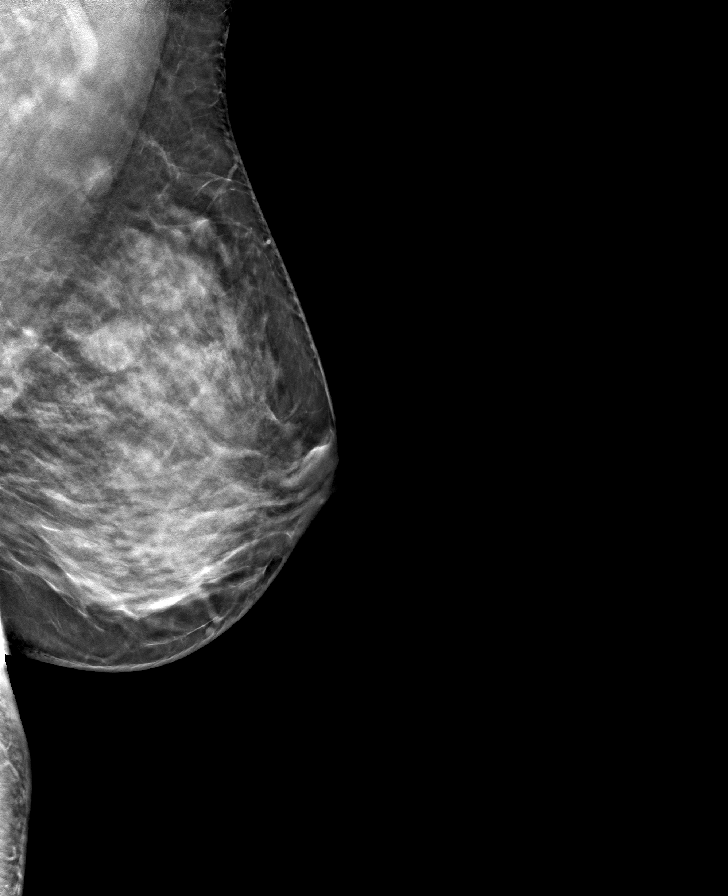

[8 of 24 positions shown; findings below may reference images not displayed]

ACR Breast Density Category c: The breast tissue is heterogeneously
dense, which may obscure small masses
FINDINGS: There are no findings suspicious for malignancy. The images were
evaluated with computer-aided detection.
IMPRESSION: No mammographic evidence of malignancy. A result letter of this
screening mammogram will be mailed directly to the patient.

RECOMMENDATION:
Screening mammogram in one year. (Code:CQ-1-UDH)

BI-RADS CATEGORY  1: Negative.

## 2023-06-12 ENCOUNTER — Encounter: Payer: Self-pay | Admitting: Physician Assistant

## 2023-06-12 ENCOUNTER — Ambulatory Visit (INDEPENDENT_AMBULATORY_CARE_PROVIDER_SITE_OTHER): Payer: 59 | Admitting: Physician Assistant

## 2023-06-12 ENCOUNTER — Other Ambulatory Visit (INDEPENDENT_AMBULATORY_CARE_PROVIDER_SITE_OTHER): Payer: 59

## 2023-06-12 ENCOUNTER — Telehealth: Payer: Self-pay | Admitting: Orthopaedic Surgery

## 2023-06-12 DIAGNOSIS — M25551 Pain in right hip: Secondary | ICD-10-CM

## 2023-06-12 MED ORDER — METHYLPREDNISOLONE ACETATE 40 MG/ML IJ SUSP
40.0000 mg | INTRAMUSCULAR | Status: AC | PRN
  Administered 2023-06-12: 40 mg via INTRA_ARTICULAR

## 2023-06-12 MED ORDER — LIDOCAINE HCL 1 % IJ SOLN
5.0000 mL | INTRAMUSCULAR | Status: AC | PRN
  Administered 2023-06-12: 5 mL

## 2023-06-12 NOTE — Progress Notes (Signed)
Office Visit Note   Patient: Emily Orozco           Date of Birth: 11/25/1980           MRN: [id number] Visit Date: 06/12/2023              Requested by: Cleatis Polka., MD [address] [place],  [place] [address] PCP: Cleatis Polka., MD   Assessment & Plan: Visit Diagnoses:  1. Pain of right hip     Plan: Achante is a pleasant 42 year old woman who is normally a patient of Dr. Eliberto Ivory.  She has had difficulties with her right hip.  In the past she has had an intra-articular injection into the hip and had femoral acetabular impingement.  She is also had a trochanteric bursa injection.  Today her pain is focused over the trochanteric bursa.  She has no pain in the groin or pain with manipulation.  Went forward with an injection may follow-up as needed  Follow-Up Instructions: As needed  Orders:  Orders Placed This Encounter  Procedures  . XR HIP UNILAT W OR W/O PELVIS 2-3 VIEWS RIGHT   No orders of the defined types were placed in this encounter.     Procedures: Large Joint Inj: R greater trochanter on 06/12/2023 3:44 PM Indications: pain and diagnostic evaluation Details: 1.5 in and 3.5 in lateral approach  Arthrogram: No  Medications: 5 mL lidocaine 1 %; 40 mg methylPREDNISolone acetate 40 MG/ML Outcome: tolerated well, no immediate complications Procedure, treatment alternatives, risks and benefits explained, specific risks discussed. Consent was given by the patient.     Clinical Data: No additional findings.   Subjective: Chief Complaint  Patient presents with  . Right Hip - Pain    HPI patient is a pleasant 42 year old woman with a chief complaint of right lateral hip pain.  Denies any injuries but was on her feet quite a bit over the holiday.  Hurts focally over the lateral side of her right hip she cannot sleep on the side.  Denies any groin pain.  Has had similar symptoms in the past  Review of Systems  All other systems reviewed and  are negative.    Objective: Vital Signs: LMP 07/03/2000   Physical Exam Constitutional:      Appearance: Normal appearance.  Skin:    General: Skin is warm and dry.  Neurological:     General: No focal deficit present.     Mental Status: She is alert and oriented to person, place, and time.  Psychiatric:        Mood and Affect: Mood normal.        Behavior: Behavior normal.    Ortho Exam Examination of her right hip she has no erythema no redness compartments are soft and nontender she is neurovascularly intact she has no pain with manipulation of the hip.  She is focally tender over the lateral trochanteric bursa there is no radicular findings strength is intact Specialty Comments:  No specialty comments available.  Imaging: No results found.   PMFS History: Patient Active Problem List   Diagnosis Date Noted  . Rectal bleeding 12/28/2021  . S/P laparoscopic assisted vaginal hysterectomy (LAVH) 05/22/2016  . Essential hypertension 11/07/2013  . Snoring 11/07/2013  . Episodic memory loss 11/07/2013  . Migraine with status migrainosus 11/07/2013  . Obesity, unspecified 11/07/2013  . Encounter for tubal ligation 10/05/2013  . CHRONIC RHINITIS 07/25/2007  . Asthma 07/25/2007   Past Medical History:  Diagnosis Date  . Allergic rhinitis   . Allergy   . Anemia   . Asthma    Hx: rarely uses inhaler - "have not used it in years"  . Bronchitis    Hx  . 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 Ectopic pregnancy 07/2012  . Encounter for tubal ligation 10/05/2013  . Esophageal stricture   . Gallstones   . GERD (gastroesophageal reflux disease)   . Hemorrhoid   . Hiatal hernia   . Hypertension   . IBS (irritable bowel syndrome)   . Obesity, unspecified   . S/P laparoscopic assisted vaginal hysterectomy (LAVH) 05/22/2016  . Snoring 11/07/2013  . Unspecified essential hypertension 11/07/2013    Family History  Problem Relation Age of Onset  .  Hypertension Mother   . Leukemia Father   . Migraines Brother   . Multiple sclerosis Brother   . Hypertension Maternal Grandmother   . Colon cancer Maternal Grandfather   . Stroke Maternal Grandfather   . Prostate cancer Maternal Grandfather   . Heart attack Other        Grandmother  . Esophageal cancer Neg Hx   . Rectal cancer Neg Hx   . Stomach cancer Neg Hx   . Pancreatic cancer Neg Hx     Past Surgical History:  Procedure Laterality Date  . CHOLECYSTECTOMY  2009  . CRYOABLATION    . LAPAROSCOPIC TUBAL LIGATION Left 10/06/2013   Procedure: Opertive Laparoscopy; Fulguration of Left Fallopian Tube;  Surgeon: Sherian Rein, MD;  Location: [facility];  Service: Gynecology;  Laterality: Left;  . LAPAROSCOPIC VAGINAL HYSTERECTOMY WITH SALPINGECTOMY Left 05/22/2016   Procedure: LAPAROSCOPIC ASSISTED VAGINAL HYSTERECTOMY W/LEFT SALPINGECTOMY;  Surgeon: Sherian Rein, MD;  Location: [facility];  Service: Gynecology;  Laterality: Left;  . LAPAROSCOPY  07/29/2012   Procedure: LAPAROSCOPY OPERATIVE;  Surgeon: Bing Plume, MD;  Location: [facility];  Service: Gynecology;  Laterality: Right;  Evacuation Hemoperitoneum, Right Salpingectomy   . SALPINGOOPHORECTOMY Right   . TUBAL LIGATION    . UPPER GI ENDOSCOPY     Social History   Occupational History  . Not on file  Tobacco Use  . Smoking status: Never  . Smokeless tobacco: Never  Vaping Use  . Vaping status: Never Used  Substance and Sexual Activity  . Alcohol use: Yes    Comment: occasional wine  . Drug use: No  . Sexual activity: Yes    Birth control/protection: Surgical

## 2023-06-12 NOTE — Telephone Encounter (Signed)
Patient called advised she is hurting pretty bad in her right hip. Patient said whether she is sitting or standing it still hurts. Patient asked to be seen today if possible. I advised Dr. Magnus Ivan is out of the office in surgery.  Patient is scheduled today with [place].  The number to contact patient is [phone]

## 2024-05-05 ENCOUNTER — Encounter: Payer: Self-pay | Admitting: Radiology
